# Patient Record
Sex: Female | Born: 1970 | Race: Black or African American | Hispanic: No | Marital: Single | Smoking: Current every day smoker
Health system: Southern US, Community
[De-identification: ages and names within clinical notes are randomized; demographics above are authoritative.]

---

## 2002-11-27 ENCOUNTER — Other Ambulatory Visit: Admission: RE | Admit: 2002-11-27 | Discharge: 2002-11-27 | Payer: Self-pay | Admitting: Obstetrics and Gynecology

## 2003-03-06 ENCOUNTER — Emergency Department (HOSPITAL_COMMUNITY): Admission: EM | Admit: 2003-03-06 | Discharge: 2003-03-06 | Payer: Self-pay | Admitting: Emergency Medicine

## 2003-03-06 ENCOUNTER — Encounter: Payer: Self-pay | Admitting: Emergency Medicine

## 2003-05-01 ENCOUNTER — Inpatient Hospital Stay (HOSPITAL_COMMUNITY): Admission: AD | Admit: 2003-05-01 | Discharge: 2003-05-03 | Payer: Self-pay | Admitting: Obstetrics and Gynecology

## 2004-06-29 ENCOUNTER — Emergency Department (HOSPITAL_COMMUNITY): Admission: EM | Admit: 2004-06-29 | Discharge: 2004-06-30 | Payer: Self-pay | Admitting: Emergency Medicine

## 2004-06-30 ENCOUNTER — Inpatient Hospital Stay (HOSPITAL_COMMUNITY): Admission: AD | Admit: 2004-06-30 | Discharge: 2004-06-30 | Payer: Self-pay | Admitting: Obstetrics and Gynecology

## 2004-07-14 ENCOUNTER — Encounter (INDEPENDENT_AMBULATORY_CARE_PROVIDER_SITE_OTHER): Payer: Self-pay | Admitting: Specialist

## 2004-07-14 ENCOUNTER — Ambulatory Visit (HOSPITAL_COMMUNITY): Admission: RE | Admit: 2004-07-14 | Discharge: 2004-07-14 | Payer: Self-pay | Admitting: Obstetrics and Gynecology

## 2004-11-26 ENCOUNTER — Other Ambulatory Visit: Admission: RE | Admit: 2004-11-26 | Discharge: 2004-11-26 | Payer: Self-pay | Admitting: Obstetrics and Gynecology

## 2007-08-08 ENCOUNTER — Inpatient Hospital Stay (HOSPITAL_COMMUNITY): Admission: RE | Admit: 2007-08-08 | Discharge: 2007-08-10 | Payer: Self-pay | Admitting: Obstetrics and Gynecology

## 2007-09-04 ENCOUNTER — Ambulatory Visit: Payer: Self-pay | Admitting: Family Medicine

## 2007-09-04 ENCOUNTER — Inpatient Hospital Stay (HOSPITAL_COMMUNITY): Admission: EM | Admit: 2007-09-04 | Discharge: 2007-09-07 | Payer: Self-pay | Admitting: Emergency Medicine

## 2007-09-04 ENCOUNTER — Ambulatory Visit: Payer: Self-pay | Admitting: Cardiology

## 2007-09-05 ENCOUNTER — Encounter: Payer: Self-pay | Admitting: Family Medicine

## 2007-09-07 ENCOUNTER — Encounter (INDEPENDENT_AMBULATORY_CARE_PROVIDER_SITE_OTHER): Payer: Self-pay | Admitting: Interventional Radiology

## 2007-09-17 ENCOUNTER — Ambulatory Visit: Payer: Self-pay | Admitting: Family Medicine

## 2007-09-17 DIAGNOSIS — E049 Nontoxic goiter, unspecified: Secondary | ICD-10-CM | POA: Insufficient documentation

## 2007-09-17 DIAGNOSIS — O903 Peripartum cardiomyopathy: Secondary | ICD-10-CM

## 2007-10-02 ENCOUNTER — Ambulatory Visit: Payer: Self-pay | Admitting: Cardiology

## 2007-10-02 LAB — CONVERTED CEMR LAB
BUN: 10 mg/dL (ref 6–23)
Calcium: 9 mg/dL (ref 8.4–10.5)
Creatinine, Ser: 0.9 mg/dL (ref 0.4–1.2)
GFR calc Af Amer: 90 mL/min
GFR calc non Af Amer: 74 mL/min
Glucose, Bld: 118 mg/dL — ABNORMAL HIGH (ref 70–99)
Potassium: 4 meq/L (ref 3.5–5.1)
Pro B Natriuretic peptide (BNP): 193 pg/mL — ABNORMAL HIGH (ref 0.0–100.0)

## 2010-03-21 ENCOUNTER — Emergency Department (HOSPITAL_COMMUNITY): Admission: EM | Admit: 2010-03-21 | Discharge: 2010-03-21 | Payer: Self-pay | Admitting: Emergency Medicine

## 2010-09-12 ENCOUNTER — Encounter: Payer: Self-pay | Admitting: *Deleted

## 2010-10-08 LAB — DIFFERENTIAL
Basophils Absolute: 0 10*3/uL (ref 0.0–0.1)
Basophils Relative: 0 % (ref 0–1)
Eosinophils Absolute: 0 10*3/uL (ref 0.0–0.7)
Eosinophils Relative: 0 % (ref 0–5)
Lymphocytes Relative: 18 % (ref 12–46)
Lymphs Abs: 1.1 10*3/uL (ref 0.7–4.0)
Monocytes Absolute: 0.2 10*3/uL (ref 0.1–1.0)
Monocytes Relative: 3 % (ref 3–12)
Neutro Abs: 5 10*3/uL (ref 1.7–7.7)
Neutrophils Relative %: 79 % — ABNORMAL HIGH (ref 43–77)

## 2010-10-08 LAB — COMPREHENSIVE METABOLIC PANEL
ALT: 18 U/L (ref 0–35)
AST: 33 U/L (ref 0–37)
Albumin: 3.8 g/dL (ref 3.5–5.2)
Alkaline Phosphatase: 44 U/L (ref 39–117)
BUN: 10 mg/dL (ref 6–23)
CO2: 23 mEq/L (ref 19–32)
Calcium: 8.7 mg/dL (ref 8.4–10.5)
Chloride: 107 mEq/L (ref 96–112)
Creatinine, Ser: 0.98 mg/dL (ref 0.4–1.2)
GFR calc Af Amer: >60 mL/min (ref >60)
GFR calc non Af Amer: >60 mL/min (ref >60)
Glucose, Bld: 142 mg/dL — ABNORMAL HIGH (ref 70–99)
Potassium: 3.3 mEq/L — ABNORMAL LOW (ref 3.5–5.1)
Sodium: 136 mEq/L (ref 135–145)
Total Bilirubin: 1 mg/dL (ref 0.3–1.2)
Total Protein: 6.9 g/dL (ref 6.0–8.3)

## 2010-10-08 LAB — LIPASE, BLOOD: Lipase: 24 U/L (ref 11–59)

## 2010-10-08 LAB — CBC
HCT: 37.8 % (ref 36.0–46.0)
Hemoglobin: 12.7 g/dL (ref 12.0–15.0)
MCH: 29.1 pg (ref 26.0–34.0)
MCHC: 33.6 g/dL (ref 30.0–36.0)
MCV: 86.7 fL (ref 78.0–100.0)
Platelets: 139 10*3/uL — ABNORMAL LOW (ref 150–400)
RBC: 4.36 MIL/uL (ref 3.87–5.11)
RDW: 14.2 % (ref 11.5–15.5)
WBC: 6.3 10*3/uL (ref 4.0–10.5)

## 2010-10-08 LAB — URINALYSIS, ROUTINE W REFLEX MICROSCOPIC
Glucose, UA: NEGATIVE mg/dL
Hgb urine dipstick: NEGATIVE
Ketones, ur: 40 mg/dL — AB
Nitrite: NEGATIVE
Protein, ur: NEGATIVE mg/dL
Specific Gravity, Urine: 1.029 (ref 1.005–1.030)
Urobilinogen, UA: 0.2 mg/dL (ref 0.0–1.0)
pH: 6 (ref 5.0–8.0)

## 2010-10-08 LAB — POCT PREGNANCY, URINE: Preg Test, Ur: NEGATIVE

## 2010-12-07 NOTE — Discharge Summary (Signed)
NAMESEMIAH, KONCZAL NO.:  [id number]   MEDICAL RECORD NO.:  [id number]          PATIENT TYPE:  INP   LOCATION:  6527                         FACILITY:  [facility]   PHYSICIAN:  Leighton Roach McDiarmid, M.D.DATE OF BIRTH:  09-25-1970   DATE OF ADMISSION:  09/04/2007  DATE OF DISCHARGE:  09/07/2007                               DISCHARGE SUMMARY   ADDENDUM:  This addendum just includes the patient's contact information.  Her home  phone number is [phone], that is in [place].  Her cell phone number  is [phone], also in [place].  Her address is [address]  [place], [place] [address].      Asher Muir, MD  Electronically Signed      Leighton Roach McDiarmid, M.D.  Electronically Signed    SO/MEDQ  D:  09/07/2007  T:  09/08/2007  Job:  [id number]

## 2010-12-07 NOTE — Consult Note (Signed)
[name], [name] NO.:  [id number]   MEDICAL RECORD NO.:  [id number]          PATIENT TYPE:  INP   LOCATION:  6527                         FACILITY:  [facility]   PHYSICIAN:  Nestor Ramp, MD        DATE OF BIRTH:  [birth year]   DATE OF CONSULTATION:  09/05/2007  DATE OF DISCHARGE:                                 CONSULTATION   TIME OF CONSULTATION:  1445.   CONSULTING SURGEON:  Rita Burke, M.D.   REQUESTING PHYSICIAN:  Nestor Ramp, MD.   PRIMARY CARE PHYSICIAN:  Rita Burke, M.D. with gynecology.   REASON FOR CONSULTATION:  Right thyroid mass with possible associated  airway compression.   HISTORY OF PRESENT ILLNESS:  We have been asked by Dr. Denny Levy to  evaluate 40 year old Rita Burke.  She is recently status post vaginal  delivery August 08, 2007 of a healthy baby.  No significant  complications during the postpartum period.  She did have mild pregnancy-  induced hypertension, but did not have any symptoms consistent with  heart failure or other complications.  She was subsequently discharged  home.  She presented to the ER on the date of admission, September 04, 2007, complaining of shortness of breath.  She reports this has been  progressive in nature, intermittent and becoming more frequent with the  main respiratory symptoms consisting of orthopnea and dyspnea on  exertion.  She was not experiencing any type of cough, fevers or chills  that she can recall.  In the ER, chest x-ray was consistent with  probable heart failure, edema and possible bronchitis.  She has been  admitted.  Cardiology consult has been obtained.  A workup is in  progress to rule out postpartum cardiomyopathy.  She has been treated  with Lasix and her respiratory symptoms have markedly improved.  She is  still on oxygen, but she reports her orthopnea and dyspnea on exertion  are better.  She does have a known history of a right thyroid mass since  2004 and has not  been evaluated by a surgical physician for this.  Since  admission, she has had thyroid studies checked.  The TSH and T3-T4 are  all within normal limits.  Her white count has also been normal.  She  has undergone a CT of the neck and chest today that shows patchy air  space disease as well as a right thyroid mass measuring 4.3 x 3.6 cm.  There is associated subglottic tracheal decrease in size to a diameter  of 7.5 mm.  We have been asked by the medicine team to evaluate the  patient because of the tracheal abnormality seen on CT.   REVIEW OF SYSTEMS:  As per the history present illness.  Constitutional,  again the patient has reported no fevers, chills or other infectious-  type symptoms prior to onset of symptoms.  She has noticed over the past  years, slight progressive increase in size of the anterior neck goiter  especially on the right.  Her respiratory symptoms are as above.  They  have become better since admission.  From a cardiac standpoint, she does  report mild problems with hypertension at the end of the prior  pregnancy.  After discharge, she was experiencing some lower extremity  edema.  Otherwise all other systems are negative or noncontributory.   FAMILY MEDICAL HISTORY:  Significant for cardiac disease and diabetes.  No known history of thyroid disorder.  Otherwise noncontributory.   SOCIAL HISTORY:  The patient has a boyfriend.  She has 3 children, 2 of  which who live with her.  She denies tobacco or alcohol use, but the  initial H&P, the patient did admit to drinking some vodka several days  prior to admission.  She does work as a Associate Professor.   PAST MEDICAL HISTORY:  1. Known right thyroid mass since 2004.  2. Mild pregnancy-induced hypertension with most recent pregnancy.  3. Anemia secondary recent pregnancy and delivery.   PAST SURGICAL HISTORY:  D&C for spontaneous abortion in 2005.   ALLERGIES:  NKDA.   CURRENT MEDICATIONS:  While here at the hospital  include the following  medications:  1. Lisinopril 5 mg daily started today.  2. Lovenox for DVT prophylaxis.  3. Aspirin.  4. Lasix 40 mg IV b.i.d.  5. Micronor daily.  6. Tessalon Perles t.i.d. cough.  Apparently the patient has developed      a cough since presenting to the hospital.   PHYSICAL EXAMINATION:  GENERAL:  The patient is pleasant in no apparent  acute distress.  She is examined while lying supine in the bed.  Oxygen  is in place.  VITAL SIGNS:  Temperature 97.5, BP 137/86, pulse 06 and regular,  respirations 16.  HEENT:  Eyes are normal in appearance.  Lids are  normal.  No edema.  Sclerae noninjected.  Conjunctivae are pink without  any exudate.  Pupils are equal and react to light.  Ears are without  lesions.  Otoscopic exam was deferred.  Nose is without rhinorrhea or  bleeding.  Mouth is pink and moist.  No noted ulcers.  NECK:  Trachea is midline.  Thyroid on inspection, there is obvious  visible goiter very enlarged on the right with some associated bulging  which is subtle on the left probably secondary to subtle mass effect  from the very large mass on the right side of the neck.  The right neck  mass is in the lower part of the neck, the upper part of the thyroid.  It is firm and mobile, but is nontender.  I do not feel any secondary  nodularity.  I am unable to appreciate any other masses other than this  mass.  Pressing on this mass does not elicit any respiratory symptoms.  RESPIRATORY:  Respiratory effort is nonlabored and normal.  She is  currently using cannula O2.  She is lying flat without any obvious signs  of respiratory distress.  LUNGS:  Sounds are clear bilaterally without any wheeze, rhonchi or  rales.  CARDIOVASCULAR:  Heart sounds are S1-S2 without obvious rubs, murmurs,  thrills or gallops.  Her pulses are regular.  Telemetry monitor  demonstrates sinus tachycardia.  CHEST:  The breasts are symmetrical in  appearance.  Otherwise, breast exam  has been deferred.  GI:  Abdomen is soft.  Bowel sounds are present.  Her abdomen is  nontender in all 4 quadrants.  There is no obvious hepatosplenomegaly,  masses or bruits.  No scars.  LYMPHATICS:  There is significant enlargement in the neck  on the right  side, deferring any appreciable evaluation with palpation of any right  neck adenopathy and none appreciated on the left side of the neck.  There was no obvious infraclavicular adenopathy.  MUSCULOSKELETAL:  No obvious cyanosis or clubbing.  EXTREMITIES:  Symmetrical in appearance.  SKIN:  Benign.  NEURO:  Cranial nerves II-XII are grossly intact.  DTR exam was  deferred.  Gross examination of sensation in the upper and lower  extremities shows intact sensation.  PSYCHIATRIC:  The patient is oriented to time, person, place and  situation.  Her affect is normal.   LABORATORY DATA:  Sodium 138, potassium 3.4, CO2 26, glucose 92, BUN 7,  creatinine 0.90, white count 6200, hemoglobin 12.1, platelets 155,000,  TSH 1.728, T3 is 3.7, T4 is 1.11.   DIAGNOSTICS:  1. CT of the neck and chest as noted.  In addition, the radiologist      although he is concerned about a possible malignancy regarding the      right neck mass, he is unable to appreciate any lymph nodes that      would be consistent with malignancy in the neck.  2. Chest x-ray as per the history of present illness.  3. A 2-D echo has been ordered and has been completed, but physician      has not read.   IMPRESSION:  1. Enlarging right thyroid mass without evidence of acute thyroiditis,      rule out malignancy.  2. 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  3. Questioning underlying bronchitis based on admission chest x-ray      and symptoms.  4. Narrowed trachea seen on CT.  Currently, the patient compensated      from a symptom standpoint.  5. Anemia secondary to recent  pregnancy and delivery.  6. Recent mild pregnancy-induced hypertension with pregnancy that      ended January 2009.   PLAN:  1. We will go ahead ultrasound the neck.  This would help clarify the      lesion whether it is solid versus cystic.  This was partially seen      on a neck CT.  This will help determine potential timing of the      surgical intervention.  On exam, this is a firm lesion and will      definitely need eventual biopsy and excision at some point.  As      noted, the thyroid studies and clinical exam exclude acute      thyroiditis.  2. At this time, feel that the majority of respiratory symptoms are      related more to the patient's presentation with heart failure and      possible postpartum cardiomyopathy and not compression from the      thyroid mass.  The mass has been enlarging on a progressive nature      with probably progressive narrowing in the trachea.  Her initial      presentation of symptoms are also more consistent with a pulmonary      etiology for her symptoms, i.e. hypervolemic CHF as evidenced by      symptoms of orthopnea and dyspnea on exertion which have improved      since treatment for these conditions.  3. Dr. Luisa Hart will review the CT as well as the ultrasound which we      have ordered.  4. Recommend weaning  her O2 and see how she does off oxygen.  At this      time, I do not see any urgent need for surgical excision of neck      mass, noting that with compression over the neck mass, no      respiratory symptoms or cough are elicited.  Therefore, surgery can      probably be elective.  Additional recommendations per Dr. Luisa Hart.      Allison L. Rolene Course      Nestor Ramp, MD  Electronically Signed    ALE/MEDQ  D:  09/05/2007  T:  09/06/2007  Job:  [id number]   cc:   Nestor Ramp, MD  Jonelle Sidle, MD  Rita A. Normand Sloop, M.D.

## 2010-12-07 NOTE — Discharge Summary (Signed)
Rita Burke, Rita Burke NO.:  [id number]   MEDICAL RECORD NO.:  [id number]          PATIENT TYPE:  INP   LOCATION:  6527                         FACILITY:  [facility]   PHYSICIAN:  Leighton Roach McDiarmid, M.D.DATE OF BIRTH:  11-Sep-1970   DATE OF ADMISSION:  09/04/2007  DATE OF DISCHARGE:  09/07/2007                               DISCHARGE SUMMARY   PRIMARY CARE Shaheed Schmuck:  Gentry Fitz.   CONSULTANTS:  Cardiology and General Surgery.   PROCEDURES:  2D echocardiogram, CT of chest and neck, neck ultrasound,  and fine-needle aspiration with biopsy.   REASON FOR ADMISSION:  The patient is a 40 year old female status post  normal spontaenous vaginal delivery on August 07, 2006, who presents  with 2 days of shortness of breath and 7 to 10 days of cough and chest  pain.   LABORATORY DATA:  Pertinent admission labs include CBC that showed a  white blood cell count of 7, hemoglobin 14, hematocrit 42.7, and  platelets 176.  Basic metabolic panel that was essentially within normal  limits with a sodium of 140, potassium 3.5, chloride 106, bicarb 26, BUN  6, creatinine 0.87, and glucose 113.  BNP was found to be 1463.  LFTs  were within normal limits and UA showed large hemoglobin, small  leukocytes esterase, and rare bacteria.  PT was 13.5, INR was 1, and PTT  was 26.  Admission studies included a chest x-ray that showed cardiac  enlargement and edema that was consistent with CHF, also could not  exclude a coexistent bronchitis.  An EKG was performed which showed  sinus tachycardia, left atrial enlargement, and T-wave abnormalities in  V5, V6, aVL, and I.   DISCHARGE DIAGNOSES:  1. Peripartum cardiomyopathy.  2. Thyroid mass.  3. Viral upper respiratory infection.   MEDICATIONS:  Medications at the time of discharge include:  1. Lisinopril 5 mg orally daily.  2. Coreg 6.25 mg orally b.i.d.  3. Spironolactone 25 mg orally daily.  4. Ortho-Tri-Cyclen Lo one tablet daily, the  patient was to start this      when she finishes this month's Micronor oral contraceptive which      she is currently taking.  The patient was also strongly encouraged      to use condoms in addition to her oral birth control pills.   BRIEF HISTORY AND HOSPITAL COURSE:  1. Cardiovascular.  The patient had signs and symptoms and      echocardiographic findings consistent with peripartum      cardiomyopathy.  Cardiology was consulted in the emergency      department and echo was obtained that showed a markedly dilated      left ventricle with an ejection fraction estimated to be 25%.      There was also left ventricular hypokinesis with regional      variations, but no evidence of regional wall motion abnormalities.      There was also mildly increased left ventricular wall thickness.      Aortic valve was found to be mildly calcified, and there was      trivial  aortic valvular regurgitation.  There was mild mitral valve      regurgitation with an effective orifice of mitral regurgitation,      surface area of 0.18 sq cm.  The volume of mitral regurgitation by      proximal isovelocity surface area was 24 mL.  Additionally, the      left atrium was mildly dilated.  On initial presentation, it was      felt that a pulmonary embolism needed to be ruled out.  Thus, the      patient had a chest CT angio which showed no pulmonary embolism.      The patient had initially been started on Lovenox and aspirin, both      of those were discontinued.  She was started on Coreg, which was      eventually titrated up to 6.25 mg b.i.d.  She was started on      lisinopril 5 mg.  She was initially started on Lasix which was      changed to spironolactone 25 mg daily.  The patient is to follow up      with Dr. Diona Browner at [facility].  Fasting lipid values,      cholesterol 167, triglycerides 45, HDL 62, LDL 96, and total      cholesterol-to-HDL ratio 2.7.  The patient was tested for HIV which       came back negative.  The patient also had a urine drug screen which      came back positive only for marijuana.  An  alcohol level was      tested within 24 hours of admission and came back with an ETOH      level of 7.  The patient did state that she had a drink prior to      coming in to the emergency department in order to try to help with      her cough.  2. Thyroid mass.  On admission, the patient was found to have a large      right-sided thyroid mass.  CT of her neck showed a large thyroid      mass with the right lobe of the thyroid measuring 4.7 x 4.1 cm and      displacing the airway to the left narrowing the airway to 7.6 mm.      Following that, an ultrasound of the mass was done which showed a      dominant complex mass in the right lobe of the thyroid with a      biopsy recommended.  Surgery consult was obtained and surgeons      recommended a fine-needle aspiration of the mass with a biopsy, and      the patient is to follow up with surgery in 2 to 3 weeks and      discuss the result and plans based on the pathology of that biopsy.      At the time of this dictation, the patient is receiving her fine-      needle aspiration and will be able to be discharged after that.  If      there are any complications or changes, a discharge summary      addendum will be dictated as well.  The patient did have thyroid      hormones measured which were all within normal limits.  Her TSH was      1.728, her free T3 was 3.7, and her free T4  was 1.11.  3. Birth control.  Because of the patient's diagnosis of peripartum      cardiomyopathy, it is extremely important that she does not get      pregnant until cleared by cardiology.  This was discussed multiple      times with the patient both by family practice and by cardiology.      The patient stated to me that she does not wish to have any more      children.  We have discussed different options for birth control at      the time of  discharge.  The patient prefers to take Ortho-Tri-      Cyclen Lo, which she has tolerated well in the past.  At the      current time, she is taking Micronor.  So, the patient was      instructed to start the Ortho-Tri-Cyclen Lo as soon as this month's      Micronor is finished.  The patient is also strongly encouraged to      use condoms in additions to her oral contraceptives.  The patient      is scheduled to follow up with her obstetrician at [facility]      in approximately 2 weeks.  At that time, she plans to discuss the      possibility of getting an IUD or a bilateral tubal ligation.  The      patient seems to understand the risk of pregnancy given her      diagnosis of peripartum cardiomyopathy.  4. No primary care Chandrea Zellman.  The patient has been without a primary      care Roselle Norton for quite some time.  She goes to urgent care when      she is sick.  She has gotten regular OB care when she has been      pregnant.  Discussed the possibilities with the patient.  She has      agreed to come and see me in my clinic at [facility] with an appointment on September 17, 2007.  5. Cough.  Based on the chest x-ray and the patient's symptoms, this      was thought to be probably due to a viral upper respiratory      infection.  During her hospitalization, she was given Jerilynn Som and she may use guaifenesin or other mucolytics over the      counter as needed for cough.   CONDITION ON DISCHARGE:  Stable and improved.   DISPOSITION:  The patient was discharged home in stable condition.  Followup appointments are as follows:  1. The patient is to follow up with me, Dr. Asher Muir, at [facility] on September 17, 2007, at 01:30 p.m.  2. The patient is to follow up with Dr. Diona Browner at [facility]      Cardiology, phone number [phone], on October 02, 2007.  She has a      lab appointment at 2:45 and a doctor's appointment at 3:15.   She      has been instructed to record her daily weight and bring those with      her to her appointment.  3. The patient is to follow up with Dr. Luisa Hart at [facility]      Surgery, phone number [phone].  The patient is to call for an  appointment to be scheduled in 2 to 3 weeks.   FOLLOWUP ISSUES AT THE TIME OF DISCHARGE:  1. The pathology of the thyroid fine-needle aspiration must be      followed up.  Surgery is to do that.  2.  Birth control, the      patient has been encouraged to find a more permanent method of      birth control as she does not wish to have any more children and a      pregnancy at this time could be very detrimental to her health.  2. Titration of the patient's medications for CHF.  This is to be done      by cardiology primarily.      Asher Muir, MD  Electronically Signed      Leighton Roach McDiarmid, M.D.  Electronically Signed    SO/MEDQ  D:  09/07/2007  T:  09/08/2007  Job:  [id number]   cc:   [facility]  Jonelle Sidle, MD  Clovis Pu Cornett, M.D.

## 2010-12-07 NOTE — Consult Note (Signed)
Rita Burke, Rita Burke NO.:  [id number]   MEDICAL RECORD NO.:  [id number]          PATIENT TYPE:  EMS   LOCATION:  MAJO                         FACILITY:  [facility]   PHYSICIAN:  Rita Sidle, MD DATE OF BIRTH:  01-14-1971   DATE OF CONSULTATION:  09/04/2007  DATE OF DISCHARGE:                                 CONSULTATION   CHIEF COMPLAINT:  Shortness of breath and cough.  Date of consultation, 09/04/07   HISTORY OF PRESENT ILLNESS:  This is a 40 year old, G5, P3-0-2-3, female  with history of normal vaginal delivery around August 08, 2007, with a  2-day hospital course who two days prior to admission developed coughing  of yellow mucus with associated shortness of breath, worse when lying  flat, not affected by exertion and chest pain for 1 day, as per patient  secondary to cough.  She has not had symptoms like this in the past.  She has not taken any medications for her symptoms.  Her symptoms  improve when she is sitting up but are not affected by rest.  She has  not had similar problems with prior pregnancies or in the past.  She had  one episode of emesis in the emergency room during this encounter  secondary to a coughing spell.  In the emergency room, she was given  Lasix 40 IV, nitroglycerin, and Zofran.  She notes that her lower  extremities have remained a bit swollen following her delivery, but have  generally improved.  She has not been complaining of lightheadedness or  feelings of palpitations.  There has been no radiation of her chest pain  and is only present when she is coughing.  She has had no hemoptysis.  She has no primary care physician but is seen by a GYN physician with  [facility].   ALLERGIES:  No known drug allergies.   MEDICATIONS:  Some type of oral contraceptive pill that begins with an  M, that she has been taking for one month to help with her breast  feeding, though she has not been breast feeding.  She also takes an  iron  pill once a day.   PAST MEDICAL HISTORY:  GYN history as previously stated and anemia.  She  also has a history of a right thyroid nodule since 2004 that was  reportedly associated with normal to borderline TFTs, although they are  not in the medical record.  She states that 10 months ago she noticed  that the thyroid nodule had started getting bigger but has been stable  over the last few months.  She has not had this worked up recently.  Also includes two spontaneous abortions.   SOCIAL HISTORY:  She lives in [place] with her boyfriend and two of  her daughters.  Her oldest child lives with the child's father, who is  not her current boyfriend.  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  She has no history of smoking.  She  has not drunk alcohol for a year but had 2 ounces of  vodka yesterday to  help with her cough which had no effect.  She used marijuana 2 weeks ago  but denies other herbal medications or illicit drugs.   FAMILY HISTORY:  She has a healthy mother.  Her father has insulin-  dependent diabetes, hypertension, and some sort of heart problem, but  she does not know what kind or how old he was when he developed it.  She  has two sisters who are doing okay.   REVIEW OF SYSTEMS:  Pertinent for persistent scant bloody discharge  since her delivery which is similar to what she has had in the past  following deliveries.   PHYSICAL EXAMINATION:  VITAL SIGNS:  Temperature 97.9, pulse 120,  respiratory rate 22, blood pressure 164/113, O2 saturation 97% on room  air, 99% on 2 liters.  GENERAL APPEARANCE:  She looked a little uncomfortable until she had a  coughing fit that led to a brief episode of emesis with mild to moderate  amount of yellow sputum. After doing so, she did appear much more  comfortable.  She does appear her apparent stated age.  HEENT:  Exam was essentially normal.  NECK:  She had a large, firm, nontender apparent  right thyroid mass that  extended over the midline of her throat.  No carotid bruits, no JVD.  No  bruit was auscultated over the thyroid mass.  She had no  lymphadenopathy.  CARDIOVASCULAR:  S1, S2.  No murmurs.  When I examined her, she had  regular rate and rhythm with a heart rate of 98, but she was tachycardic  before and then tachycardic again later during the encounter based on  her monitors.  Possible aoft S3.  LUNGS:  Clear to auscultation bilaterally.  No crackles or rhonchi  despite her x-ray findings.  SKIN:  She had no rashes.  ABDOMEN:  Soft, nontender, nondistended. Normal bowel sounds.  EXTREMITIES:  She had trace bilateral pitting edema.  No calf  tenderness, no palpable cords.  NEUROLOGIC:  She was alert and oriented x3.  Cranial nerves II-XII  intact.   Chest x-ray was pertinent for mild edema pattern, could not rule out  bronchitis.   EKG showed some nonspecific ST changes in the V4, V5, and V6 with  question of left atrial enlargement, otherwise normal sinus rhythm.  Normal axes, no hypertrophy.  No previous EKGs to compare it to.   LABORATORY DATA:  Hemoglobin 14, hematocrit 42.7, white blood cell count  7, platelets 176.  Sodium140, potassium 4.3, chloride 110, bicarb 26,  BUN 7, creatinine 1, glucose 99.  She had a D-dimer of 1.99.  MB 1.3,  troponin less than 0.05.   ASSESSMENT AND PLAN:  A 40 year old female, gravida 5, para 3-0-2-3, one  month post normal vaginal delivery with 2 days of cough, shortness of  breath, pleuritic chest pain, and large thyroid mass.  Chest x-ray and  BNP findings consistent with volume overload, question heart failure.  Potential etiologies include postpartum cardiomyopathy, thyroiditis  contributing to a cardiomyopathy.  Also possible is a pulmonary embolus  among other potential etiologies of her symptoms.  We have contacted  family medicine who will be admitting the patient, and we will be  following along with them.  We  are deferring on a CT scan with contrast  secondary to the potential that she has a thyroid condition that would  be exacerbated by iodine-based contrast media, so we will order a 2-D  echocardiogram for now, obtain thyroid  function tests.  We will treat  with low-dose Lovenox in case she does have a PE.  Recommend admission  to a telemetry bed, and following the results of her thyroid workup and  2-D echocardiogram, can determine if a CT scan is warranted.  If a CT  scan is ordered, would the thyroid mass be better visualized with a CT  scan or with an ultrasound, and that will be based on whether or not a  CT scan is obtained for  the potential of a pulmonary embolus.  We will also cycle cardiac  enzymes and serial EKGs are two things we would recommend.  We will get  another set of cardiac enzymes while she is in the emergency room in  addition to a PT, PTT, TSH, free T4, and T3.  This has been discussed  with Dr. Alanda Amass of family practice.      Rita Burke, M.D.  Electronically Signed      Rita Sidle, MD  Electronically Signed    JC/MEDQ  D:  09/04/2007  T:  09/04/2007  Job:  [id number]

## 2010-12-07 NOTE — H&P (Signed)
Rita Burke, Rita Burke                ACCOUNT NO.:  [id number]   MEDICAL RECORD NO.:  [id number]          PATIENT TYPE:  INP   LOCATION:                                FACILITY:  [facility]   PHYSICIAN:  Naima A. Dillard, M.D. DATE OF BIRTH:  10-24-1970   DATE OF ADMISSION:  08/08/2007  DATE OF DISCHARGE:                              HISTORY & PHYSICAL   This patient is a gravida 5, para 2-0-2-2, at 39-6/7 weeks, who  presented at the office with complaint of contractions about every 15  minutes with a little bit of spotting and also with a complaint of  continued lower extremity edema.  The patient was seen for a labor check  at the office and her cervix was noted to be 4.5 cm, 80% effaced and -2  to -1, vertex presentation, with a bulging bag of water.  The patient's  weight in the office was 225.  She had a trace amount of protein in her  urine.  Blood pressure was 140/70.  She denied headache or epigastric  pain or blurry vision.  She did report some occasional dizziness,  especially with position changes.  She denied any leakage of fluid and.  to note, group beta strep was positive.  Her care has been followed by  the certified nurse midwife service at [facility] and history is  remarkable for:   1. Advanced maternal age.  The patient is 40 years old.  2. Her rubella titer was equivocal.  3. History of spontaneous abortion x2.  4. Group beta strep positive.   PRENATAL LABS:  On February 15, 2007, the patient's blood type was noted to  be A positive.  Her Rh antibody screen was negative.  Sickle cell  negative.  RPR nonreactive.  Rubella equivocal.  Hepatitis surface  antigen negative.  HIV nonreactive.  Her hemoglobin upon that day was  11.4 and her platelets were 171.  As was noted, her group beta strep was  positive.  At approximately 28 weeks the patient had a one-hour GTT and  hemoglobin was 10.5.  RPR was nonreactive.  Her one-hour GTT at 28 weeks  was within normal limits,  equal to 126.  The patient declined  amniocentesis as well as quad screen.   ALLERGIES:  The patient denies medication or latex allergy as well as  other sensitivities.   PAST MEDICAL HISTORY:  The patient reports oral contraceptive pill use  in the past, which she discontinued April 2008.  She reports a D&C in  2006.  She was treated for chlamydia in 1990.  Other than her  childbirth, she has not been hospitalized for any other complications or  surgery.   FAMILY HISTORY:  Father with high blood pressure.  Father is also  insulin-dependent diabetic.   GENETIC HISTORY:  The patient is advanced maternal age.  She is 40 years  old.  Otherwise unremarkable.   SOCIAL HISTORY:  The patient is a single black female.  Father of the  baby's name is Rita Burke.  They do not report a religious  affiliation.  They both have high school educations.  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  She denied any alcohol, tobacco or illicit drug use.   OBSTETRICAL HISTORY:  The patient had a vaginal delivery in June 1991, a  female infant weighing 7 pounds at approximately 40 weeks' gestation.  She  was in labor 8-9 hours.  She denied any complications.  The child's name  is Rita Burke and was delivered by Dr. Pennie Rushing.  Gravida 2, the patient had a  vaginal delivery in October 2004, a female weighing 7 pounds at  approximately 40 weeks' gestation.  She was in labor for 6-7 hours.  She  denied any complications.  That delivery was by Hilda Lias L. Williams,  C.N.M.  Her daughter's name is Rita Burke.  In December 2005 she had a  spontaneous abortion at approximately 7 weeks with a subsequent D&C and  in March 2006 was gravida 4, she had a spontaneous abortion around 7  weeks as well with a subsequent D&C.  Gravida 5 is her current  pregnancy.   HISTORY OF PRESENT PREGNANCY:  The patient entered care February 08, 2007,  for her new OB nurse interview.  She  then had on July 24 her new OB  workup.  She was approximately [redacted] weeks pregnant.  Implications  regarding advanced maternal age were discussed.  The patient at that  time was unsure regarding amniocentesis or quad screen.  The patient was  to let the office know if she desired either of those two.  She then  returned around 19 weeks for her anatomy scan and planned CNM care at  that time.  Still had some persisting nausea and was to fill a Zofran.  Her ultrasound that day was showing fetal intrauterine pregnancy with  size equal to dates, placenta was posterior, cervix 3.13 cm with normal  growth and fluid and normal anatomy.  Prior risk was previously 1 to 185  and after normal anatomy scan was 1 in 370 risk.  Again discussed  amniocentesis and quad screen and the patient continued to decline at  that time.  The patient did have a normal one-hour GTT around 28 weeks  and her hemoglobin was 10.5 at that time.  Did discuss the need for  rubella vaccine postpartum secondary to equivocal status.  The patient  was doing well and without complaints.  The patient's pregnancy  progressed without additional complications.  Her cervix was  approximately 2 cm at around 36 weeks.  Blood pressure did begin to  elevate slightly around 37 weeks.  She was 138/70 and repeat was 130/70.  Group beta strep was done at 35-6/7 weeks and was noted to be positive.  The patient was seen at 39 weeks 1 day and noted female baby to be named  [place].  She did report irregular contractions.  She did develop dependent  lower extremity edema, 3+ pitting.  Her blood pressure was 140/82 at  that time.  She did not have any PIH signs or symptoms and her cervix  was 3+ cm.  The patient was then seen in office January 14 at 39-6/7  weeks and planned for direct admit secondary to early labor.   PHYSICAL EXAM:  The patient's weight was 225 in office today.  Blood  pressure was 140/70.  The patient was afebrile and other vital  signs  were stable.  She had had trace protein on her urine  sample in the  office.  GENERAL:  She was not in any acute distress.  She was alert and oriented  x4 and pleasant.  HEENT:  Within normal limits.  CARDIOVASCULAR:  Regular rate and rhythm.  LUNGS:  Clear to auscultation bilaterally.  ABDOMEN:  Soft and nontender, gravid, fundal height was approximately 40  cm.  Fetal heart tones were in the 140s.  PELVIC:  The patient's cervix was 4.5 cm, 80%, and -2 to -1 station,  vertex, with a bulging bag of water.  A small amount of bloody show was  noted.  EXTREMITIES:  Bilateral lower extremities with 3+ pitting edema.  DTRs  were 2+.   ASSESSMENT:  1. Intrauterine pregnancy at 39-6/7 weeks.  2. Early labor.  3. Group beta strep positive.  4. Advanced maternal age.  5. Rubella equivocal.   PLAN:  1. Admit to birthing suites as a direct admission with Dr. Normand Sloop as      attending physician.  2. Routine certified nurse midwife orders.  3. Epidural p.r.n. although the patient in office voiced to try      natural childbirth.  4. If blood pressure is borderline on admission, plan PIH labs.  5. Support p.r.n.  6. Penicillin IV q.4h. per GBS prophylactic protocol.  7. After IV administration of antibiotics, plan artificial rupture of      membranes for labor augmentation.      Candice Pasatiempo, CNM      Naima A. Normand Sloop, M.D.  Electronically Signed    CHS/MEDQ  D:  08/08/2007  T:  08/08/2007  Job:  [id number]

## 2010-12-07 NOTE — H&P (Signed)
Rita Burke, Rita Burke NO.:  [id number]   MEDICAL RECORD NO.:  [id number]          PATIENT TYPE:  INP   LOCATION:  6527                         FACILITY:  [facility]   PHYSICIAN:  Nestor Ramp, MD        DATE OF BIRTH:  May 02, 1971   DATE OF ADMISSION:  09/04/2007  DATE OF DISCHARGE:                              HISTORY & PHYSICAL   CHIEF COMPLAINT:  Shortness of breath and cough.   PRIMARY CARE Rita Burke:  The patient is unassigned, has no primary care  Rita Burke.   HISTORY OF PRESENT ILLNESS:  The patient is a 40 year old female status  post normal spontaneous vaginal delivery on August 08, 2007, who  presents with shortness of breath, chest pain, and cough for 2 days.  The patient describes her chest pain as a throbbing pain that is  substernal that occurs primarily when coughing, not accompanied by  nausea, vomiting, or diaphoresis.  The patient reports the shortness of  breath which started 2 days ago has progressively worsened over the past  2 days.  It is worse when lying down and standing up seems to relieve  somewhat.  It is not related to exercise at all.  Additionally, the  patient has had a cough present for 7 to 10 days, but with a history of  no other upper respiratory symptoms.  No congestion, rhinorrhea, or sore  throat, is productive of yellow sputum.  Cough is worsened over the past  week.  The patient also reports some lower extremity edema.  This began,  however, in her seventh month of pregnancy, noted in her OB history and  physical to be about 2+ to 3+ pitting edema, but she reports this has  improved since her delivery.  Additionally, the patient reports some  episodes of paroxysmal nocturnal dyspnea for the past 2 days and three-  pillow orthopnea.  Also of note, the patient drove herself to the  emergency department.  When she arrived at the emergency department, she  reported her chest pain to be 5 to 6 out of 10.  She was given  nitroglycerin 3 doses, which eventually reduced the pain to 0/10.   REVIEW OF SYSTEMS:  Positive as above.  Negative for fevers, headaches,  palpitation, dry skin, neurological changes, changes in bowel or bladder  habits, nausea, vomiting, and diarrhea.  In the emergency department,  the patient received a chest x-ray, supplemental oxygen, sublingual  nitroglycerin x3, Lasix 40 mg x1, Zofran 4 mg x1, and cardiology was  consulted while she was in the emergency department.   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  She states that she has had no formal work up for this,  but that she has had some blood tests done in 2004, which were within  normal limits.  The patient also reports that this mass has gotten  larger since that time particularly in the last 10 months.   MEDICATIONS:  She takes Micronor  1 tablet daily.  Of note, the patient  is no longer breast feeding and she takes iron 325 mg 1 tablet daily.   ALLERGIES:  No known drug allergies.   FAMILY MEDICAL HISTORY:  Includes diabetes, heart problems on the  father's side, but the patient is not sure what specific heart problems.  The patient has 2 sisters who are healthy.   SOCIAL HISTORY:  No smoking, occasional alcohol.  No illicit drugs.  The  patient lives with her 3 children and her boyfriend.  She works as a  Associate Professor.   PHYSICAL EXAMINATION:  VITAL SIGNS:  Temperature of 97.9 to 98.0,  respirations 18 to 22, pulse 108 to 120, blood pressure 131 to 164 over  90 to 113, and oxygen saturation 97% on 2 L.  GENERAL:  The patient is in no acute distress, resting comfortably on 2  L of oxygen.  HEENT:  The patient was normocephalic, atraumatic.  Pupils equally  round, and reactive to light.  Oropharynx was clear.  Moist mucous  membranes.  No exophthalmos.  NECK:  The patient had a large nontender thyroid  mass on her neck, right  side much greater than left side.  No discrete nodules were felt.  No  JVD was noted.  CARDIOVASCULAR:  The patient was tachycardiac with a regular rhythm.  No  murmurs, rubs, or gallops detected.  PULMONARY:  The patient was clear to auscultation bilaterally.  ABDOMEN:  The patient's abdomen was soft, nontender, and nondistended  with positive bowel sounds.  No hepatosplenomegaly.  EXTREMITIES:  The patient was noted to have 1+ pitting edema in the  bilateral extremities, but no pretibial edema.  NEUROLOGIC:  The patient was alert and oriented x3.  Had 5/5 strength in  upper and lower extremities.   LABORATORY DATA:  Included a BNP which is found to be elevated at 1463.  Point-of-care cardiac enzymes showed a myoglobin of 34.7, a CK-MB of  1.3, and a troponin of less than 0.05.  CBC was within normal limits  with a white blood cell count of 7.0, hemoglobin of 14.0, hematocrit  42.7, and platelets 176.  Differential was within normal limits.  Basic  metabolic panel showed a sodium of 140, potassium of 3.5, chloride of  106, bicarb of 26, BUN of 6, creatinine 0.87, glucose of 113, total  protein 7.1, albumin 3.7, AST 17, ALT 14, calcium 8.9, total bilirubin  1.1, and alkaline phosphatase 71.  UA showed large hemoglobin, small  leukocyte esterase, and rare bacteria.  PT was 13.5, INR was 1.0, and  PTT was 26.  A D-dimer was elevated at 1.99.  Chest x-ray showed cardiac  enlargement and edema consistent with CHF, but could not exclude  coexistent bronchitis.  EKG showed sinus tachycardia, left atrial  enlargement, and T-wave inversion in aVL, V1, V5, and V6.   ASSESSMENT/PLAN:  The patient is a 40 year old G5, P 3-0-2-3, status  post normal spontaneous vaginal delivery on August 08, 2007, admitted  for cough, shortness of breath, lower extremity edema concerning for  postpartum cardiomyopathy.  1. Cardiovascular:  Cardiology has already seen and examined the       patient.  We will follow their recommendations.  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  We will      hold off on the chest CT angiography for now until  we confirm that      the thyroid studies are grossly normal as contrast would be      detrimental in the setting of hyperthyroidism.  We will start      Lovenox at treatment dose until the thyroid studies are back, and      we decide whether or not to get the CT.  We will check a 2D      echocardiogram, we will cycle cardiac enzymes, place the patient in      a telemetry bed, and we will check serial EKGs.  We appreciate      cardiology's care for this patient.  The patient also had increased      blood pressure, but we will hold off for now as we do not want to      start her on a beta blocker if she is in a congestive heart failure      exacerbation.  We will also start her on aspirin.  2. Thyroid mass.  The patient has a long history of a thyroid mass      with an increase in size particularly over the past 10 months.  As      stated previously, the patient reports that the blood tests done in      2004 were normal, but she states she was told they were      borderline.  These tests were done at her obstetrician office;      however, the patient reports no other work up has been done.  We      will check thyroid studies and then evaluate either with an      ultrasound or CT.  Some of this may depend on if we are getting a      CT angiography for her shortness of breath.  3. Gynecologic.  The patient is on Micronor, we will continue this for      now; however, the patient is no longer breastfeeding; so at some      point, it may be better to switch her to combination of oral      contraceptive at discharge.  However, this is not a pressing issue      to settle at this time.  4. No primary care Aneliese Beaudry.  The patient does need  a primary care      Sahily Biddle.  We will order social work consult      to address this.  Cough, we will give Tessalon Perles.  Cough could      be cardiac or of upper respiratory infection.  5. Disposition.  Pending work up results and clinical improvement.      Asher Muir, MD  Electronically Signed      Nestor Ramp, MD  Electronically Signed    SO/MEDQ  D:  09/05/2007  T:  09/06/2007  Job:  [id number]

## 2010-12-07 NOTE — Assessment & Plan Note (Signed)
[facility]                            CARDIOLOGY OFFICE NOTE   SHIARA, MCGOUGH                         MRN:          [id number]  DATE:10/02/2007                            DOB:          October 25, 1970    PRIMARY CARE PHYSICIAN:  Asher Muir, MD, at the [facility].   REASON FOR VISIT:  Follow up peripartum cardiomyopathy.   HISTORY OF PRESENT ILLNESS:  I saw Ms. Berent back in February with  shortness of breath following a normal vaginal delivery in mid January.  She was ultimately diagnosed with a cardiomyopathy, ejection fraction of  25%, and associated with trivial aortic regurgitation, mild mitral  regurgitation had a mildly dilated left atrium.  She had a CT scan of  the chest done around that time, which excluded a pulmonary embolus, and  also further imaging of an enlarged right thyroid gland.  This was  ultimately biopsied and she tells me that no cancerous lesions were  found.  I presume that she was diagnosed with a goiter.  From the  perspective of her myopathy, she is not reporting any significant chest  pain and has NYHA class I dyspnea on exertion.  She also denies  orthopnea, lower extremity edema and PND.  Today's electrocardiogram  shows sinus rhythm at 61 beats per minute with left atrial enlargement  and nonspecific ST-T wave changes, predominantly in the inferolateral  leads.  She reports compliance with her medications and is due for  follow-up blood work today.   ALLERGIES:  No known drug allergies.   PRESENT MEDICATIONS:  1. Lisinopril 5 mg p.o. daily.  2. Carvedilol 6.25 mg p.o. b.i.d.  3. Spirolactone 25 mg p.o. daily.   REVIEW OF SYSTEMS:  As described in the history of present illness.  Otherwise negative.   EXAMINATION:  Blood pressure is 130/79, heart rate is 69, weight is 179  pounds.  The patient is comfortable, in no acute distress.  HEENT:  Conjunctivae and lids normal.  Pharynx is  clear.  NECK:  Supple.  No elevated jugular venous pressure.  Enlarged right  thyroid again noted.  This area is firm and nontender.  LUNGS:  Clear without labored breathing at rest.  CARDIAC:  A regular rate and rhythm.  No S3 gallop or pericardial rub.  ABDOMEN:  Soft, nontender, normoactive bowel sounds.  EXTREMITIES:  No significant pitting edema.  Distal pulses are 2+.  SKIN:  Warm and dry.  MUSCULOSKELETAL:  No kyphosis noted.  NEUROPSYCHIATRIC:  The patient is alert and oriented x3.  Affect is  appropriate.   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  Symptomatically the patient is quite stable  on the present medical regimen.  I plan a follow-up BMET and BNP and we  will see her back over the next 3 months with a repeat echocardiogram.  Hopefully, she will return to normal function over time.  I counseled  her once again that she should absolutely avoid repeat pregnancy.  She  states that she understands this and  is using contraceptive measures.     Jonelle Sidle, MD  Electronically Signed    SGM/MedQ  DD: 10/02/2007  DT: 10/03/2007  Job #: [id number]   cc:   Asher Muir, MD

## 2010-12-10 NOTE — H&P (Signed)
NAMEJANYLA, BISCOE                          ACCOUNT NO.:  [id number]   MEDICAL RECORD NO.:  [id number]                   PATIENT TYPE:  INP   LOCATION:  9118                                 FACILITY:  [facility]   PHYSICIAN:  Naima A. Dillard, M.D.              DATE OF BIRTH:  [birth year]   DATE OF ADMISSION:  05/01/2003  DATE OF DISCHARGE:                                HISTORY & PHYSICAL   HISTORY OF PRESENT ILLNESS:  This is a 40 year old gravida 2 para 1-0-0-1 at  45 and zero-sevenths weeks who presents with regular uterine contractions  since 5 a.m.  She denies leaking, bleeding, reports positive fetal movement.  Pregnancy has been followed by the nurse midwife service and remarkable for:  1. Late to care.  2. Right thyroid nodule with normal thyroid studies.  3. Equivocal rubella.  4. Group B strep negative.   OBSTETRICAL HISTORY:  Remarkable for vaginal delivery in 1990 of a female  infant at [redacted] weeks gestation weighing 7 pounds 12 ounces with no  complications.   MEDICAL HISTORY:  Remarkable for childhood varicella.   SURGICAL HISTORY:  Remarkable for wisdom teeth extraction in 2003.   FAMILY HISTORY:  Remarkable for a father with hypertension and diabetes and  a mother with nicotine use.   GENETIC HISTORY:  Unremarkable.   SOCIAL HISTORY:  The patient is single but father of the baby, Karlyne Greenspan, is involved and supportive.  She does not report a religious  affiliation.  She denies any alcohol, tobacco, or drug use.   OBJECTIVE DATA:  VITAL SIGNS:  Stable, afebrile.  HEENT:  Within normal limits.  Thyroid normal except for a 2 cm mass on the  right side.  CHEST:  Clear to auscultation.  HEART:  Regular rate and rhythm.  ABDOMEN:  Gravid, vertex to Leopold's.  EFM shows a reassuring fetal heart  rate with uterine contractions every two minutes.  PELVIC:  Cervical exam is 8 cm, completely effaced, -1 station, with a  vertex presentation, intact membranes which are  bulging.  EXTREMITIES:  Within normal limits.   PRENATAL LABORATORY DATA:  Hemoglobin 11.8, platelets 187.  Blood type A  positive, antibody screen negative.  Sickle cell negative.  RPR negative.  Rubella equivocal.  HbsAg negative.  HIV nonreactive.  Pap test normal.  Gonorrhea negative, chlamydia negative.  TSH 1.051.  Quad screen within  normal limits.  Group B strep negative.   ASSESSMENT:  1. Intrauterine pregnancy at 28 and zero-sevenths weeks.  2. Active labor, transition.  3. Group B strep negative.   PLAN:  1. Admit to birthing suites, routine C.N.M. orders.  2. Dr. Normand Sloop notified.  3. Anticipate SVD.     Marie L. Williams, C.N.M.                 Naima A. Normand Sloop, M.D.    MLW/MEDQ  D:  05/01/2003  T:  05/01/2003  Job:  [id number]

## 2010-12-10 NOTE — Op Note (Signed)
Rita Burke, EGLE NO.:  [id number]   MEDICAL RECORD NO.:  [id number]          PATIENT TYPE:  AMB   LOCATION:  SDC                           FACILITY:  [facility]   PHYSICIAN:  Janine Limbo, M.D.DATE OF BIRTH:  05/15/1971   DATE OF PROCEDURE:  07/14/2004  DATE OF DISCHARGE:                                 OPERATIVE REPORT   PREOPERATIVE DIAGNOSES:  1.  First trimester missed abortion.  2.  Heavy vaginal bleeding.  3.  Chorioamnionitis.   POSTOPERATIVE DIAGNOSES:  1.  First trimester missed abortion.  2.  Heavy vaginal bleeding.  3.  Chorioamnionitis.   PROCEDURE:  Suction dilatation and evacuation.   SURGEON:  Janine Limbo, M.D.   ANESTHESIA:  Monitored anesthesia control, paracervical block using 0.5%  Marcaine with epinephrine.   DISPOSITION:  [name] is a 40 year old female, gravida 3, para 2-0-0-2,  who presents with decreasing quantitative beta HCG values and no viable  intrauterine gestational on ultrasound.  On July 13, 2004, the patient  noted that her bleeding started to increase.  Her bleeding became extremely  heavy today.  She noted a very foul odor from the vagina.  She was seen in  the office this morning and was noted to have large blood clots and very  heavy bleeding from the vagina.  She was brought to the [facility] for an emergent suction dilatation and curettage.  Her hemoglobin  was 10.5 in the office. Her preoperative hemoglobin at [facility] was  8.5.  We reviewed the indications for her surgical procedure.  The risks  were discussed, including anesthetic complications, bleeding, infections,  and possible damage to the surrounding organs.   FINDINGS:  The patient had an eight to 10-week size uterus.  There was a 75  mL blood clot in the vagina upon entering the operating room.  The cervix  was dilated.  There was a small amount of active bleeding present.  The  patient's blood type is A  positive.  A large amount of products of  conception were removed from within the uterus.   PROCEDURE:  The patient was taken to the operating room, where she was given  medication through her IV line.  The patient's perineum and vagina were  prepped with multiple layers of Betadine.  The bladder was drained of urine.  Examination under anesthesia was performed.  The patient was sterilely  draped.  A speculum was placed in the vagina and a paracervical block was  placed using 10 mL of 0.5% Marcaine with epinephrine.  Products of  conception were noted within the cervical os.  These were removed using ring  forceps.  The cavity was then explored and emptied using a size 8 suction  curette, followed by a medium sharp curette.  Hemostasis was adequate at the  end of our procedure. The cavity was felt to be clean.  All instruments were  removed.  The examination was repeated and the cervix was noted to be eight  weeks' size and more firm.  She was awakened from her  anesthetic and then  taken to the recovery room in stable condition.  The patient received 1 g of  Ancef in the operating room as well as 30 mg of Toradol IV in the operating  room.   FOLLOW-UP INSTRUCTIONS:  The patient was given a prescription for Vicodin,  and she will take one or two tablets every four hours as needed for pain.  She was given a prescription for doxycycline, and she will take one tablet  twice each day for 10 days.  She will return to see Dr. Stefano Gaul in two to  three weeks for follow-up examination.  She was given a copy of the  postoperative instruction sheet as prepared by the [facility]  [facility] for patients who have undergone a dilatation and curettage.  She  was told to call for questions or concerns.     Arth   AVS/MEDQ  D:  07/14/2004  T:  07/14/2004  Job:  [id number]

## 2011-04-13 LAB — CBC
HCT: 31.9 — ABNORMAL LOW
Hemoglobin: 11 — ABNORMAL LOW
MCHC: 34.6
MCV: 87.2
Platelets: 136 — ABNORMAL LOW
RBC: 3.65 — ABNORMAL LOW
RDW: 14.4
WBC: 7

## 2011-04-13 LAB — RPR: RPR Ser Ql: NONREACTIVE

## 2011-04-14 LAB — CBC
MCHC: 33.8
RBC: 2.91 — ABNORMAL LOW
WBC: 10.3

## 2011-04-15 LAB — ETHANOL: Alcohol, Ethyl (B): 7

## 2011-04-15 LAB — CBC
HCT: 39.4
Hemoglobin: 14
MCHC: 32.8
MCV: 88.1
MCV: 92.9
Platelets: 144 — ABNORMAL LOW
Platelets: 155
RBC: 3.83 — ABNORMAL LOW
RBC: 4.4
RDW: 14.7
WBC: 4.1
WBC: 6.2

## 2011-04-15 LAB — POCT CARDIAC MARKERS
CKMB, poc: 1.3
Myoglobin, poc: 34.7
Operator id: 295021
Troponin i, poc: <0.05

## 2011-04-15 LAB — BASIC METABOLIC PANEL
BUN: 10
CO2: 26
CO2: 26
Chloride: 100
Chloride: 105
Chloride: 106
Creatinine, Ser: 0.9
Creatinine, Ser: 0.9
GFR calc Af Amer: >60
GFR calc Af Amer: >60
GFR calc Af Amer: >60
GFR calc non Af Amer: >60
Potassium: 3.3 — ABNORMAL LOW
Potassium: 3.4 — ABNORMAL LOW
Potassium: 3.6

## 2011-04-15 LAB — URINALYSIS, ROUTINE W REFLEX MICROSCOPIC
Glucose, UA: NEGATIVE
Protein, ur: NEGATIVE
pH: 6.5

## 2011-04-15 LAB — DIFFERENTIAL
Basophils Absolute: 0.1
Basophils Relative: 1
Eosinophils Absolute: 0.1
Eosinophils Relative: 1
Monocytes Absolute: 0.4
Neutro Abs: 4.5

## 2011-04-15 LAB — COMPREHENSIVE METABOLIC PANEL
AST: 17
Albumin: 3.7
BUN: 6
Calcium: 8.9
Creatinine, Ser: 0.87
GFR calc Af Amer: >60

## 2011-04-15 LAB — DRUGS OF ABUSE SCREEN W/O ALC, ROUTINE URINE
Amphetamine Screen, Ur: NEGATIVE
Benzodiazepines.: NEGATIVE
Creatinine,U: 84.6
Opiate Screen, Urine: NEGATIVE
Propoxyphene: NEGATIVE

## 2011-04-15 LAB — URINE MICROSCOPIC-ADD ON

## 2011-04-15 LAB — TSH: TSH: 1.728

## 2011-04-15 LAB — CARDIAC PANEL(CRET KIN+CKTOT+MB+TROPI)
CK, MB: 1.4
Relative Index: INVALID
Troponin I: 0.04

## 2011-04-15 LAB — LIPID PANEL
Cholesterol: 167
HDL: 62
LDL Cholesterol: 96
Total CHOL/HDL Ratio: 2.7
Triglycerides: 45

## 2011-04-15 LAB — PROTIME-INR
INR: 1
Prothrombin Time: 13.5

## 2011-04-15 LAB — THC (MARIJUANA), URINE, CONFIRMATION: Marijuana, Ur-Confirmation: 40 ng/mL

## 2011-04-15 LAB — I-STAT 8, (EC8 V) (CONVERTED LAB)
Bicarbonate: 25.7 — ABNORMAL HIGH
Glucose, Bld: 99
Sodium: 140
TCO2: 27
pCO2, Ven: 46.7
pH, Ven: 7.349 — ABNORMAL HIGH

## 2011-04-15 LAB — T4, FREE: Free T4: 1.11

## 2011-04-15 LAB — TROPONIN I: Troponin I: 0.06

## 2011-04-15 LAB — HIV ANTIBODY (ROUTINE TESTING W REFLEX): HIV: NONREACTIVE

## 2011-04-15 LAB — POCT I-STAT CREATININE: Creatinine, Ser: 1

## 2011-04-15 LAB — CK TOTAL AND CKMB (NOT AT ARMC): Total CK: 149

## 2011-05-13 ENCOUNTER — Other Ambulatory Visit: Payer: Self-pay | Admitting: Obstetrics & Gynecology

## 2011-05-13 DIAGNOSIS — Z1231 Encounter for screening mammogram for malignant neoplasm of breast: Secondary | ICD-10-CM

## 2011-06-06 ENCOUNTER — Ambulatory Visit (HOSPITAL_COMMUNITY)
Admission: RE | Admit: 2011-06-06 | Discharge: 2011-06-06 | Disposition: A | Discharge status: Home / Self Care | Payer: Self-pay | Source: Ambulatory Visit | Attending: Obstetrics & Gynecology | Admitting: Obstetrics & Gynecology

## 2011-06-06 DIAGNOSIS — Z1231 Encounter for screening mammogram for malignant neoplasm of breast: Secondary | ICD-10-CM

## 2020-09-10 ENCOUNTER — Emergency Department (HOSPITAL_COMMUNITY): Payer: Self-pay

## 2020-09-10 ENCOUNTER — Other Ambulatory Visit: Payer: Self-pay

## 2020-09-10 ENCOUNTER — Emergency Department (HOSPITAL_COMMUNITY)
Admission: EM | Admit: 2020-09-10 | Discharge: 2020-09-10 | Disposition: A | Discharge status: Home / Self Care | Payer: Self-pay | Attending: Emergency Medicine | Admitting: Emergency Medicine

## 2020-09-10 ENCOUNTER — Encounter (HOSPITAL_COMMUNITY): Payer: Self-pay

## 2020-09-10 DIAGNOSIS — R109 Unspecified abdominal pain: Secondary | ICD-10-CM

## 2020-09-10 DIAGNOSIS — R112 Nausea with vomiting, unspecified: Secondary | ICD-10-CM | POA: Insufficient documentation

## 2020-09-10 DIAGNOSIS — R1013 Epigastric pain: Secondary | ICD-10-CM | POA: Insufficient documentation

## 2020-09-10 LAB — URINALYSIS, ROUTINE W REFLEX MICROSCOPIC
Bilirubin Urine: NEGATIVE
Glucose, UA: NEGATIVE mg/dL
Hgb urine dipstick: NEGATIVE
Ketones, ur: 80 mg/dL — AB
Leukocytes,Ua: NEGATIVE
Nitrite: NEGATIVE
Protein, ur: 30 mg/dL — AB
Specific Gravity, Urine: 1.028 (ref 1.005–1.030)
pH: 9 — ABNORMAL HIGH (ref 5.0–8.0)

## 2020-09-10 LAB — COMPREHENSIVE METABOLIC PANEL
ALT: 18 U/L (ref 0–44)
AST: 24 U/L (ref 15–41)
Albumin: 3.9 g/dL (ref 3.5–5.0)
Alkaline Phosphatase: 50 U/L (ref 38–126)
Anion gap: 15 (ref 5–15)
BUN: 10 mg/dL (ref 6–20)
CO2: 19 mmol/L — ABNORMAL LOW (ref 22–32)
Calcium: 9.4 mg/dL (ref 8.9–10.3)
Chloride: 102 mmol/L (ref 98–111)
Creatinine, Ser: 1.01 mg/dL — ABNORMAL HIGH (ref 0.44–1.00)
GFR, Estimated: >60 mL/min (ref >60)
Glucose, Bld: 166 mg/dL — ABNORMAL HIGH (ref 70–99)
Potassium: 3.3 mmol/L — ABNORMAL LOW (ref 3.5–5.1)
Sodium: 136 mmol/L (ref 135–145)
Total Bilirubin: 0.8 mg/dL (ref 0.3–1.2)
Total Protein: 7.1 g/dL (ref 6.5–8.1)

## 2020-09-10 LAB — TROPONIN I (HIGH SENSITIVITY)
Troponin I (High Sensitivity): 6 ng/L (ref <18)
Troponin I (High Sensitivity): 5 ng/L (ref <18)

## 2020-09-10 LAB — LIPASE, BLOOD: Lipase: 28 U/L (ref 11–51)

## 2020-09-10 LAB — CBC
HCT: 40.1 % (ref 36.0–46.0)
Hemoglobin: 13 g/dL (ref 12.0–15.0)
MCH: 27.7 pg (ref 26.0–34.0)
MCHC: 32.4 g/dL (ref 30.0–36.0)
MCV: 85.5 fL (ref 80.0–100.0)
Platelets: 135 10*3/uL — ABNORMAL LOW (ref 150–400)
RBC: 4.69 MIL/uL (ref 3.87–5.11)
RDW: 14.6 % (ref 11.5–15.5)
WBC: 7.4 10*3/uL (ref 4.0–10.5)
nRBC: 0 % (ref 0.0–0.2)

## 2020-09-10 LAB — I-STAT BETA HCG BLOOD, ED (MC, WL, AP ONLY): I-stat hCG, quantitative: <5 m[IU]/mL (ref <5)

## 2020-09-10 MED ORDER — MORPHINE SULFATE (PF) 4 MG/ML IV SOLN
4.0000 mg | Freq: Once | INTRAVENOUS | Status: AC
  Administered 2020-09-10: 4 mg via INTRAVENOUS
  Filled 2020-09-10: qty 1

## 2020-09-10 MED ORDER — ONDANSETRON 8 MG PO TBDP: 8.0000 mg | ORAL_TABLET | Freq: Three times a day (TID) | ORAL | 0 refills | Status: DC | PRN

## 2020-09-10 MED ORDER — OMEPRAZOLE 20 MG PO CPDR: 20.0000 mg | DELAYED_RELEASE_CAPSULE | Freq: Two times a day (BID) | ORAL | 0 refills | Status: DC

## 2020-09-10 MED ORDER — ONDANSETRON HCL 4 MG/2ML IJ SOLN
4.0000 mg | Freq: Once | INTRAMUSCULAR | Status: AC
  Administered 2020-09-10: 4 mg via INTRAVENOUS
  Filled 2020-09-10: qty 2

## 2020-09-10 NOTE — Discharge Instructions (Addendum)
Take the medications as prescribed.  Follow-up with your doctor for further evaluation if the symptoms persist.  Return to the ED for fevers chills or other worsening symptoms

## 2020-09-10 NOTE — ED Notes (Signed)
Pt is in radiology.

## 2020-09-10 NOTE — ED Provider Notes (Signed)
[facility] EMERGENCY DEPARTMENT Provider Note   CSN: [id number] Arrival date & time: 09/10/20  0522     History Chief Complaint  Patient presents with  . Abdominal Pain    Rita Burke is a 50 y.o. female.  HPI Patient presents to the ED with complaints of abdominal pain.  Patient states the pain is in the middle of her upper abdomen.  It severe pain that is constant.  It feels like there is a knot in her epigastric area.  She has had multiple episodes of nausea and vomiting.  This all started around 5 PM last evening.  Patient states she did have a quesadilla last evening.  She does not usually have any particular trouble with foods.  She denies any fevers or chills.  She denies any diarrhea or constipation.  Patient has not had any prior abdominal surgeries.  She has had one episode like this in the past and told it was related to food poisoning.  She is not having any shortness of breath.  She has not had any urinary symptoms.    History reviewed. No pertinent past medical history.  Patient Active Problem List   Diagnosis Date Noted  . GOITER, UNSPECIFIED 09/17/2007  . PERIPARTUM CARDIOMYOPATHY POSTPARTUM COND/COMP 09/17/2007    History reviewed. No pertinent surgical history.   OB History   No obstetric history on file.     History reviewed. No pertinent family history.     Home Medications Prior to Admission medications   Medication Sig Start Date End Date Taking? Authorizing Provider  omeprazole (PRILOSEC) 20 MG capsule Take 1 capsule (20 mg total) by mouth 2 (two) times daily before a meal. 09/10/20  Yes Linwood Dibbles, MD  ondansetron (ZOFRAN ODT) 8 MG disintegrating tablet Take 1 tablet (8 mg total) by mouth every 8 (eight) hours as needed for nausea or vomiting. 09/10/20  Yes Linwood Dibbles, MD  carvedilol (COREG) 6.25 MG tablet Take 6.25 mg by mouth 2 (two) times daily with meals.      [provider]  lisinopril (PRINIVIL,ZESTRIL) 5 MG  tablet Take 5 mg by mouth daily.      [provider]  spironolactone (ALDACTONE) 25 MG tablet Take 25 mg by mouth daily.      [provider]    Allergies    Patient has no known allergies.  Review of Systems   Review of Systems  All other systems reviewed and are negative.   Physical Exam Updated Vital Signs BP 117/60   Pulse 66   Temp 98.6 F (37 C) (Oral)   Resp 17   Ht 1.689 m (5' 6.5")   Wt 80.3 kg   LMP 09/07/2020   SpO2 92%   BMI 28.15 kg/m   Physical Exam Vitals and nursing note reviewed.  Constitutional:      Appearance: She is well-developed and well-nourished. She is ill-appearing.     Comments: Appears to be in pain  HENT:     Head: Normocephalic and atraumatic.     Right Ear: External ear normal.     Left Ear: External ear normal.  Eyes:     General: No scleral icterus.       Right eye: No discharge.        Left eye: No discharge.     Conjunctiva/sclera: Conjunctivae normal.  Neck:     Trachea: No tracheal deviation.  Cardiovascular:     Rate and Rhythm: Normal rate and regular rhythm.  Pulses: Intact distal pulses.  Pulmonary:     Effort: Pulmonary effort is normal. No respiratory distress.     Breath sounds: Normal breath sounds. No stridor. No wheezing or rales.  Abdominal:     General: Bowel sounds are normal. There is no distension.     Palpations: Abdomen is soft.     Tenderness: There is abdominal tenderness in the epigastric area. There is guarding. There is no rebound. Negative signs include Murphy's sign.  Musculoskeletal:        General: No tenderness or edema.     Cervical back: Neck supple.  Skin:    General: Skin is warm and dry.     Findings: No rash.  Neurological:     Mental Status: She is alert.     Cranial Nerves: No cranial nerve deficit (no facial droop, extraocular movements intact, no slurred speech).     Sensory: No sensory deficit.     Motor: No abnormal muscle tone or seizure activity.      Coordination: Coordination normal.     Deep Tendon Reflexes: Strength normal.  Psychiatric:        Mood and Affect: Mood and affect normal.     ED Results / Procedures / Treatments   Labs (all labs ordered are listed, but only abnormal results are displayed) Labs Reviewed  COMPREHENSIVE METABOLIC PANEL - Abnormal; Notable for the following components:      Result Value   Potassium 3.3 (*)    CO2 19 (*)    Glucose, Bld 166 (*)    Creatinine, Ser 1.01 (*)    All other components within normal limits  CBC - Abnormal; Notable for the following components:   Platelets 135 (*)    All other components within normal limits  URINALYSIS, ROUTINE W REFLEX MICROSCOPIC - Abnormal; Notable for the following components:   pH 9.0 (*)    Ketones, ur 80 (*)    Protein, ur 30 (*)    Bacteria, UA RARE (*)    All other components within normal limits  LIPASE, BLOOD  I-STAT BETA HCG BLOOD, ED ([facility], [facility], [facility] ONLY)  TROPONIN I (HIGH SENSITIVITY)  TROPONIN I (HIGH SENSITIVITY)    EKG EKG Interpretation  Date/Time:  Thursday September 10 2020 10:09:24 EST Ventricular Rate:  66 PR Interval:    QRS Duration: 88 QT Interval:  435 QTC Calculation: 456 R Axis:   76 Text Interpretation: Sinus rhythm Left atrial enlargement Nonspecific T abnormalities, lateral leads t wave inversion in lateral leads on previous ECG are no longer present Confirmed by Linwood Dibbles [phone]) on 09/10/2020 10:35:04 AM   Radiology DG Chest 2 View  Result Date: 09/10/2020 CLINICAL DATA:  Chest pain.  Nausea and vomiting EXAM: CHEST - 2 VIEW COMPARISON:  09/05/2007 FINDINGS: Normal heart size and mediastinal contours. Rotated frontal view. No acute infiltrate or edema. No effusion or pneumothorax. No acute osseous findings. IMPRESSION: No active cardiopulmonary disease. Electronically Signed   By: Marnee Spring M.D.   On: 09/10/2020 06:12   US Abdomen Complete  Result Date: 09/10/2020 CLINICAL DATA:  Abdominal pain, vomiting  EXAM: ABDOMEN ULTRASOUND COMPLETE COMPARISON:  None. FINDINGS: Gallbladder: No gallstones or wall thickening visualized. No sonographic Murphy sign noted by sonographer. Common bile duct: Diameter: Normal caliber, 3 mm. Liver: No focal lesion identified. Within normal limits in parenchymal echogenicity. Portal vein is patent on color Doppler imaging with normal direction of blood flow towards the liver. IVC: No abnormality visualized. Pancreas: Visualized portion  unremarkable. Spleen: Size and appearance within normal limits. Right Kidney: Length: 12 cm. Echogenicity within normal limits. No mass or hydronephrosis visualized. Left Kidney: Length: 9.8 cm. Echogenicity within normal limits. No mass or hydronephrosis visualized. Abdominal aorta: No aneurysm visualized. Other findings: None. IMPRESSION: Normal abdominal ultrasound. Electronically Signed   By: Charlett Nose M.D.   On: 09/10/2020 10:13   DG Abdomen Acute W/Chest  Result Date: 09/10/2020 CLINICAL DATA:  Abdominal pain, vomiting EXAM: DG ABDOMEN ACUTE WITH 1 VIEW CHEST COMPARISON:  03/21/2010 FINDINGS: There is no evidence of dilated bowel loops or free intraperitoneal air. No radiopaque calculi or other significant radiographic abnormality is seen. Heart size and mediastinal contours are within normal limits. Both lungs are clear. IMPRESSION: Negative abdominal radiographs.  No acute cardiopulmonary disease. Electronically Signed   By: Charlett Nose M.D.   On: 09/10/2020 09:45    Procedures Procedures   Medications Ordered in ED Medications  morphine 4 MG/ML injection 4 mg (4 mg Intravenous Given 09/10/20 1006)  ondansetron (ZOFRAN) injection 4 mg (4 mg Intravenous Given 09/10/20 1005)    ED Course  I have reviewed the triage vital signs and the nursing notes.  Pertinent labs & imaging results that were available during my care of the patient were reviewed by me and considered in my medical decision making (see chart for  details).  Clinical Course as of 09/10/20 1247  Thu Sep 10, 2020  1033 Labs reviewed.  CBC normal.  Lipase normal.  Urinalysis still pending. [JK]  1033 Ultrasound without acute findings. [JK]  1033 X-ray without acute abnormalities. [JK]  1034 Chest x-ray without acute findings. [JK]  1212 Urinalysis is negative for signs of infection or hematuria [JK]    Clinical Course User Index [JK] Linwood Dibbles, MD   MDM Rules/Calculators/A&P                          Patient presented to the ED for evaluation of abdominal pain.  Symptoms were primarily in the upper abdomen.  They are associated with nausea and vomiting.  Patient was treated with pain medications and antinausea medications.  She is feeling much better and is no longer had any abdominal tenderness or vomiting.  Is concerned about the possibility of acute cholecystitis but ultrasound does not show evidence of gallstones or any abnormality.  Patient is not having evidence of pancreatitis on laboratory tests.  X-rays do not show findings to suggest an acute obstruction.  No hematuria or hydronephrosis noted on the ultrasound to suggest ureteral colic.  Symptoms may be related to a viral illness.  It is also possible she could have some gastritis peptic ulcer disease.  Will discharge her home on antacids and antinausea medication.  Discussed outpatient follow-up  Final Clinical Impression(s) / ED Diagnoses Final diagnoses:  Abdominal pain, unspecified abdominal location  Nausea and vomiting, intractability of vomiting not specified, unspecified vomiting type    Rx / DC Orders ED Discharge Orders         Ordered    ondansetron (ZOFRAN ODT) 8 MG disintegrating tablet  Every 8 hours PRN        09/10/20 1246    omeprazole (PRILOSEC) 20 MG capsule  2 times daily before meals        09/10/20 1246           Linwood Dibbles, MD 09/10/20 1248

## 2020-09-10 NOTE — ED Triage Notes (Signed)
Patient arrives from home with abdominal pain with nausea, vomiting, states it started about 5pm last night, reports it feels like a knot in her epigastric area.

## 2022-03-06 ENCOUNTER — Encounter (HOSPITAL_COMMUNITY): Payer: Self-pay | Admitting: Emergency Medicine

## 2022-03-06 ENCOUNTER — Emergency Department (HOSPITAL_COMMUNITY)
Admission: EM | Admit: 2022-03-06 | Discharge: 2022-03-06 | Disposition: A | Discharge status: Home / Self Care | Payer: Self-pay | Attending: Emergency Medicine | Admitting: Emergency Medicine

## 2022-03-06 DIAGNOSIS — R109 Unspecified abdominal pain: Secondary | ICD-10-CM | POA: Insufficient documentation

## 2022-03-06 DIAGNOSIS — R112 Nausea with vomiting, unspecified: Secondary | ICD-10-CM | POA: Insufficient documentation

## 2022-03-06 LAB — CBC
HCT: 42.3 % (ref 36.0–46.0)
Hemoglobin: 14 g/dL (ref 12.0–15.0)
MCH: 28.6 pg (ref 26.0–34.0)
MCHC: 33.1 g/dL (ref 30.0–36.0)
MCV: 86.3 fL (ref 80.0–100.0)
Platelets: 128 10*3/uL — ABNORMAL LOW (ref 150–400)
RBC: 4.9 MIL/uL (ref 3.87–5.11)
RDW: 15.2 % (ref 11.5–15.5)
WBC: 5.8 10*3/uL (ref 4.0–10.5)
nRBC: 0 % (ref 0.0–0.2)

## 2022-03-06 LAB — COMPREHENSIVE METABOLIC PANEL
ALT: 15 U/L (ref 0–44)
AST: 23 U/L (ref 15–41)
Albumin: 4 g/dL (ref 3.5–5.0)
Alkaline Phosphatase: 54 U/L (ref 38–126)
Anion gap: 11 (ref 5–15)
BUN: 12 mg/dL (ref 6–20)
CO2: 20 mmol/L — ABNORMAL LOW (ref 22–32)
Calcium: 9.2 mg/dL (ref 8.9–10.3)
Chloride: 108 mmol/L (ref 98–111)
Creatinine, Ser: 0.91 mg/dL (ref 0.44–1.00)
GFR, Estimated: >60 mL/min (ref >60)
Glucose, Bld: 150 mg/dL — ABNORMAL HIGH (ref 70–99)
Potassium: 3.5 mmol/L (ref 3.5–5.1)
Sodium: 139 mmol/L (ref 135–145)
Total Bilirubin: 0.6 mg/dL (ref 0.3–1.2)
Total Protein: 7.1 g/dL (ref 6.5–8.1)

## 2022-03-06 LAB — LIPASE, BLOOD: Lipase: 30 U/L (ref 11–51)

## 2022-03-06 MED ORDER — DROPERIDOL 2.5 MG/ML IJ SOLN
2.5000 mg | Freq: Once | INTRAMUSCULAR | Status: AC
  Administered 2022-03-06: 2.5 mg via INTRAMUSCULAR
  Filled 2022-03-06: qty 2

## 2022-03-06 MED ORDER — ONDANSETRON 8 MG PO TBDP: 4.0000 mg | ORAL_TABLET | Freq: Three times a day (TID) | ORAL | 0 refills | Status: DC | PRN

## 2022-03-06 NOTE — Discharge Instructions (Addendum)
You were seen in the emergency room today for nausea, vomiting and belly pain.  We did blood work that looked good.  We gave you some medicine that made you feel better.  We think you are safe to go home at this time.  We would like you to fill the prescription for the antinausea pill that dissolves under your tongue and take it as needed only.  If you are unable to keep anything down by mouth or if your abdominal pain worsens please come back to the emergency department.  Otherwise would like you to follow-up with your regular doctor to address these problems moving forward.

## 2022-03-06 NOTE — ED Triage Notes (Signed)
Patient complains of right sided abdominal pain that started this morning, described as a pulling sensation. Patient also reports vomiting.

## 2022-03-06 NOTE — ED Notes (Signed)
Patient verbalizes understanding of discharge instructions. Opportunity for questioning and answers were provided. Armband removed by staff, pt discharged from ED. Pt taken to ED entrance via wheel chair.  

## 2022-03-06 NOTE — ED Provider Notes (Signed)
[facility] EMERGENCY DEPARTMENT Provider Note   CSN: [id number] Arrival date & time: 03/06/22  1148     History  Chief Complaint  Patient presents with   Abdominal Pain    Rita Burke is a 51 y.o. female with no pertinent past medical history presents to the emergency department with several hours of intermittent generalized abdominal pain, nausea and vomiting.  The patient states that she had woken up this morning and normal state of health and had experienced gradual onset generalized abdominal pain without appreciable trigger at that is been intermittent in nature since.  She has had approximately 5 episodes of emesis today.  No associated fever or chills.  She notes that her last bowel movement was approximately 30 minutes prior.  No diarrhea.  No blood in the stool.  No vaginal bleeding or discharge.   Abdominal Pain Associated symptoms: vomiting   Associated symptoms: no chest pain, no chills, no cough, no dysuria, no fever, no hematuria, no shortness of breath and no sore throat      History reviewed. No pertinent past medical history.   Home Medications Prior to Admission medications   Medication Sig Start Date End Date Taking? Authorizing Provider  carvedilol (COREG) 6.25 MG tablet Take 6.25 mg by mouth 2 (two) times daily with meals.      [provider]  lisinopril (PRINIVIL,ZESTRIL) 5 MG tablet Take 5 mg by mouth daily.      [provider]  omeprazole (PRILOSEC) 20 MG capsule Take 1 capsule (20 mg total) by mouth 2 (two) times daily before a meal. 09/10/20   Linwood Dibbles, MD  ondansetron (ZOFRAN ODT) 8 MG disintegrating tablet Take 1 tablet (8 mg total) by mouth every 8 (eight) hours as needed for nausea or vomiting. 09/10/20   Linwood Dibbles, MD  spironolactone (ALDACTONE) 25 MG tablet Take 25 mg by mouth daily.      [provider]      Allergies    Patient has no known allergies.    Review of Systems   Review of Systems   Constitutional:  Negative for chills and fever.  HENT:  Negative for ear pain and sore throat.   Eyes:  Negative for pain and visual disturbance.  Respiratory:  Negative for cough and shortness of breath.   Cardiovascular:  Negative for chest pain and palpitations.  Gastrointestinal:  Positive for abdominal pain and vomiting.  Genitourinary:  Negative for dysuria and hematuria.  Musculoskeletal:  Negative for arthralgias and back pain.  Skin:  Negative for color change and rash.  Neurological:  Negative for seizures and syncope.  All other systems reviewed and are negative.   Physical Exam Updated Vital Signs BP (!) 155/84 (BP Location: Right Arm)   Pulse 67   Temp 98 F (36.7 C) (Oral)   Resp (!) 26   SpO2 100%  Physical Exam Vitals and nursing note reviewed.  Constitutional:      General: She is not in acute distress.    Appearance: She is well-developed.     Comments: Upon entering the exam room, the patient is lying in bed in the fetal position.  She has a blanket from her covering her.  She appears anxious  HENT:     Head: Normocephalic and atraumatic.     Mouth/Throat:     Mouth: Mucous membranes are moist.  Eyes:     Conjunctiva/sclera: Conjunctivae normal.  Cardiovascular:     Rate and Rhythm: Normal rate  and regular rhythm.     Heart sounds: No murmur heard.    Comments: Regular rate and rhythm no murmurs or gallops Pulmonary:     Effort: Pulmonary effort is normal. No respiratory distress.     Breath sounds: Normal breath sounds.     Comments: Lungs are clear to auscultation bilaterally.  Patient speaking in full sentences without difficulty. Abdominal:     Palpations: Abdomen is soft.     Comments: Soft nondistended and nontender.  No flank tenderness.  Musculoskeletal:        General: No swelling.     Cervical back: Neck supple.  Skin:    General: Skin is warm and dry.     Capillary Refill: Capillary refill takes less than 2 seconds.  Neurological:      Mental Status: She is alert.     Comments: Fully alert and oriented moving all extremity spontaneously grossly neurologically intact  Psychiatric:        Mood and Affect: Mood normal.     Comments: Patient appears anxious     ED Results / Procedures / Treatments   Labs (all labs ordered are listed, but only abnormal results are displayed) Labs Reviewed  COMPREHENSIVE METABOLIC PANEL - Abnormal; Notable for the following components:      Result Value   CO2 20 (*)    Glucose, Bld 150 (*)    All other components within normal limits  CBC - Abnormal; Notable for the following components:   Platelets 128 (*)    All other components within normal limits  LIPASE, BLOOD  URINALYSIS, ROUTINE W REFLEX MICROSCOPIC    EKG None  Radiology No results found.  Procedures Procedures    Medications Ordered in ED Medications - No data to display  ED Course/ Medical Decision Making/ A&P                           Medical Decision Making Amount and/or Complexity of Data Reviewed Labs: ordered.  Risk Prescription drug management.   Patient presents the emergency department hemodynamically stable, afebrile and with several hours of intermittent vague abdominal pain with several episodes of nausea and vomiting and a benign exam.  Differential diagnosis includes viral enteritis versus cannabinoid hyperemesis syndrome versus less likely acute surgical pathology including abscess versus appendicitis given age and benign abdominal exam.  I have personally reviewed and interpreted the patient's laboratory work-up which was grossly unremarkable.  No obstructive biliary phenomenon.  Lipase is normal.  No AKI.  We will provide droperidol for the patient's nausea and vomiting and reassess with likely plan to discharge.  Do not feel that CT scan of the abdomen and pelvis is indicated at this time.  Serial reassessment reveals the patient remains hemodynamically stable.  She has just ambulated back  from the bathroom.  She states that she has no further episodes of emesis while being here.  She states that she feels better after the droperidol.  Will discharge patient with prescription for Zofran and instructions to follow-up with her primary care physician in the next several days.  She was given strict return precautions for worsening abdominal pain or inability to tolerate oral intake.          Final Clinical Impression(s) / ED Diagnoses Final diagnoses:  Nausea and vomiting, unspecified vomiting type    Rx / DC Orders ED Discharge Orders     None  Duard Brady, MD 03/06/22 1601    Maia Plan, MD 03/09/22 [phone]

## 2022-07-06 ENCOUNTER — Emergency Department (HOSPITAL_COMMUNITY)
Admission: EM | Admit: 2022-07-06 | Discharge: 2022-07-06 | Disposition: A | Discharge status: Home / Self Care | Payer: Medicaid Other | Attending: Emergency Medicine | Admitting: Emergency Medicine

## 2022-07-06 ENCOUNTER — Emergency Department (HOSPITAL_COMMUNITY): Payer: Medicaid Other

## 2022-07-06 DIAGNOSIS — R079 Chest pain, unspecified: Secondary | ICD-10-CM | POA: Insufficient documentation

## 2022-07-06 DIAGNOSIS — M79601 Pain in right arm: Secondary | ICD-10-CM | POA: Diagnosis not present

## 2022-07-06 DIAGNOSIS — Z79899 Other long term (current) drug therapy: Secondary | ICD-10-CM | POA: Insufficient documentation

## 2022-07-06 DIAGNOSIS — I10 Essential (primary) hypertension: Secondary | ICD-10-CM | POA: Diagnosis not present

## 2022-07-06 LAB — I-STAT BETA HCG BLOOD, ED (MC, WL, AP ONLY): I-stat hCG, quantitative: <5 m[IU]/mL (ref <5)

## 2022-07-06 LAB — CBC
HCT: 41.6 % (ref 36.0–46.0)
Hemoglobin: 13.7 g/dL (ref 12.0–15.0)
MCH: 28.7 pg (ref 26.0–34.0)
MCHC: 32.9 g/dL (ref 30.0–36.0)
MCV: 87 fL (ref 80.0–100.0)
Platelets: 152 10*3/uL (ref 150–400)
RBC: 4.78 MIL/uL (ref 3.87–5.11)
RDW: 15.5 % (ref 11.5–15.5)
WBC: 6.1 10*3/uL (ref 4.0–10.5)
nRBC: 0 % (ref 0.0–0.2)

## 2022-07-06 LAB — BASIC METABOLIC PANEL
Anion gap: 9 (ref 5–15)
BUN: 10 mg/dL (ref 6–20)
CO2: 27 mmol/L (ref 22–32)
Calcium: 9.3 mg/dL (ref 8.9–10.3)
Chloride: 104 mmol/L (ref 98–111)
Creatinine, Ser: 0.91 mg/dL (ref 0.44–1.00)
GFR, Estimated: >60 mL/min (ref >60)
Glucose, Bld: 98 mg/dL (ref 70–99)
Potassium: 4.6 mmol/L (ref 3.5–5.1)
Sodium: 140 mmol/L (ref 135–145)

## 2022-07-06 LAB — T4, FREE: Free T4: 0.74 ng/dL (ref 0.61–1.12)

## 2022-07-06 LAB — TSH: TSH: 0.939 u[IU]/mL (ref 0.350–4.500)

## 2022-07-06 LAB — TROPONIN I (HIGH SENSITIVITY)
Troponin I (High Sensitivity): 4 ng/L (ref <18)
Troponin I (High Sensitivity): 4 ng/L (ref <18)

## 2022-07-06 MED ORDER — PREDNISONE 10 MG (21) PO TBPK: ORAL_TABLET | Freq: Every day | ORAL | 0 refills | Status: DC

## 2022-07-06 MED ORDER — CARVEDILOL 3.125 MG PO TABS
6.2500 mg | ORAL_TABLET | Freq: Two times a day (BID) | ORAL | Status: DC
  Filled 2022-07-06: qty 2

## 2022-07-06 MED ORDER — IOHEXOL 350 MG/ML SOLN
50.0000 mL | Freq: Once | INTRAVENOUS | Status: AC | PRN
  Administered 2022-07-06: 50 mL via INTRAVENOUS

## 2022-07-06 MED ORDER — LISINOPRIL 10 MG PO TABS
5.0000 mg | ORAL_TABLET | Freq: Every day | ORAL | Status: DC
  Filled 2022-07-06: qty 1

## 2022-07-06 MED ORDER — IBUPROFEN 400 MG PO TABS
600.0000 mg | ORAL_TABLET | Freq: Once | ORAL | Status: AC
  Administered 2022-07-06: 600 mg via ORAL
  Filled 2022-07-06: qty 1

## 2022-07-06 MED ORDER — PREDNISONE 20 MG PO TABS
60.0000 mg | ORAL_TABLET | Freq: Once | ORAL | Status: DC
  Filled 2022-07-06: qty 3

## 2022-07-06 NOTE — ED Provider Triage Note (Signed)
Emergency Medicine Provider Triage Evaluation Note  Leonette Nutting , a 51 y.o. female  was evaluated in triage.  Pt complains of neck pain with radiculopathy down the right upper extremity.  Also complains of chest pain which has been going on for the past couple days.  Denies other complaints.  Denies shortness of breath.  Review of Systems  Positive: As above Negative: as above  Physical Exam  BP (!) 178/83   Pulse 79   Temp 97.6 F (36.4 C) (Oral)   Resp 16   Ht 5' 6.5" (1.689 m)   Wt 95.3 kg   SpO2 100%   BMI 33.39 kg/m  Gen:   Awake, no distress   Resp:  Normal effort  MSK:   Moves extremities without difficulty  Other:    Medical Decision Making  Medically screening exam initiated at 11:31 AM.  Appropriate orders placed.  Zanna L Hurston was informed that the remainder of the evaluation will be completed by another provider, this initial triage assessment does not replace that evaluation, and the importance of remaining in the ED until their evaluation is complete.     Marita Kansas, PA-C 07/06/22 1132

## 2022-07-06 NOTE — ED Triage Notes (Signed)
Pt reports right sided chest pain for a few days and right sided arm and neck pain for a week. Denies any SOB.

## 2022-07-06 NOTE — ED Provider Notes (Signed)
[facility] EMERGENCY DEPARTMENT Provider Note   CSN: [id number] Arrival date & time: 07/06/22  1050     History  Chief Complaint  Patient presents with   Chest Pain   Arm Pain    Rita Burke is a 51 y.o. female.   Chest Pain Arm Pain Associated symptoms include chest pain.  Patient presents for right-sided chest and right arm pain.  Symptoms have been intermittent over the past week.  She feels that they were worsening over the past several days.  Today, she had a severe episode of pain that was primarily in her right arm.  At the time, pain was 10/10 in severity.  This prompted her to come to the ED.  Currently, pain is resolved.  Patient denies any cardiac history.  Her medical history is only notable for a goiter that was biopsied several years ago.  She has not had any new symptoms in her neck area.  She denies any recent shortness of breath, nausea, dizziness, or lightheadedness.     Home Medications Prior to Admission medications   Medication Sig Start Date End Date Taking? Authorizing Provider  predniSONE (STERAPRED UNI-PAK 21 TAB) 10 MG (21) TBPK tablet Take by mouth daily. Take 6 tabs by mouth daily  for 2 days, then 5 tabs for 2 days, then 4 tabs for 2 days, then 3 tabs for 2 days, 2 tabs for 2 days, then 1 tab by mouth daily for 2 days 07/06/22  Yes Gloris Manchester, MD  carvedilol (COREG) 6.25 MG tablet Take 6.25 mg by mouth 2 (two) times daily with meals.      [provider]  lisinopril (PRINIVIL,ZESTRIL) 5 MG tablet Take 5 mg by mouth daily.      [provider]  omeprazole (PRILOSEC) 20 MG capsule Take 1 capsule (20 mg total) by mouth 2 (two) times daily before a meal. 09/10/20   Linwood Dibbles, MD  ondansetron (ZOFRAN ODT) 8 MG disintegrating tablet Take 0.5 tablets (4 mg total) by mouth every 8 (eight) hours as needed for up to 12 doses for nausea or vomiting. 03/06/22   Duard Brady, MD  spironolactone (ALDACTONE) 25 MG tablet Take  25 mg by mouth daily.      [provider]      Allergies    Penicillins    Review of Systems   Review of Systems  Cardiovascular:  Positive for chest pain.  Musculoskeletal:  Positive for arthralgias.  All other systems reviewed and are negative.   Physical Exam Updated Vital Signs BP 128/82   Pulse 62   Temp 97.6 F (36.4 C) (Oral)   Resp 19   Ht 5' 6.5" (1.689 m)   Wt 95.3 kg   SpO2 100%   BMI 33.39 kg/m  Physical Exam Vitals and nursing note reviewed.  Constitutional:      General: She is not in acute distress.    Appearance: She is well-developed. She is not ill-appearing, toxic-appearing or diaphoretic.  HENT:     Head: Normocephalic and atraumatic.  Eyes:     Extraocular Movements: Extraocular movements intact.     Conjunctiva/sclera: Conjunctivae normal.  Cardiovascular:     Rate and Rhythm: Normal rate and regular rhythm.     Heart sounds: No murmur heard.    No friction rub. No gallop.  Pulmonary:     Effort: Pulmonary effort is normal. No tachypnea or respiratory distress.     Breath sounds: Normal breath sounds. No  decreased breath sounds, wheezing, rhonchi or rales.  Chest:     Chest wall: No tenderness.  Abdominal:     Palpations: Abdomen is soft.     Tenderness: There is no abdominal tenderness.  Musculoskeletal:        General: No swelling.     Cervical back: Normal range of motion and neck supple.     Right lower leg: No edema.     Left lower leg: No edema.  Skin:    General: Skin is warm and dry.     Coloration: Skin is not cyanotic or pale.  Neurological:     General: No focal deficit present.     Mental Status: She is alert and oriented to person, place, and time.     Cranial Nerves: No cranial nerve deficit.     Motor: No weakness.  Psychiatric:        Mood and Affect: Mood normal.        Behavior: Behavior normal.     ED Results / Procedures / Treatments   Labs (all labs ordered are listed, but only abnormal results  are displayed) Labs Reviewed  CBC  BASIC METABOLIC PANEL  TSH  T4, FREE  T3, FREE  I-STAT BETA HCG BLOOD, ED ([facility], [facility], [facility] ONLY)  TROPONIN I (HIGH SENSITIVITY)  TROPONIN I (HIGH SENSITIVITY)    EKG EKG Interpretation  Date/Time:  Wednesday July 06 2022 11:10:09 EST Ventricular Rate:  71 PR Interval:  174 QRS Duration: 74 QT Interval:  398 QTC Calculation: 432 R Axis:   66 Text Interpretation: Normal sinus rhythm Possible Left atrial enlargement Confirmed by Gloris Manchester [phone]) on 07/06/2022 5:41:43 PM  Radiology CT Chest W Contrast  Result Date: 07/06/2022 CLINICAL DATA:  Neck pain with radiculopathy going down right upper extremity, chest pain. Possible nodule seen on chest radiograph. EXAM: CT CHEST WITH CONTRAST TECHNIQUE: Multidetector CT imaging of the chest was performed during intravenous contrast administration. RADIATION DOSE REDUCTION: This exam was performed according to the departmental dose-optimization program which includes automated exposure control, adjustment of the mA and/or kV according to patient size and/or use of iterative reconstruction technique. CONTRAST:  39mL OMNIPAQUE IOHEXOL 350 MG/ML SOLN COMPARISON:  Same day chest radiograph and CTA chest 09/05/2007 FINDINGS: Cardiovascular: The heart size is normal. There is no pericardial effusion. The major vasculature of the chest is unremarkable. Mediastinum/Nodes: The thyroid is diffusely enlarged with a peripherally calcified right thyroid nodule measuring up to 3.1 cm which exerts mass effect on the trachea resulting in marked narrowing with trachea measuring 5 mm in transverse diameter (4-19). The nodule has been present since 2009 but the luminal narrowing appears worsened. The esophagus is grossly unremarkable. There is no mediastinal, hilar, or axillary lymphadenopathy. Lungs/Pleura: As above, there is marked narrowing of the trachea at the level of the thyroid. The trachea below this level and remaining  central airways are patent. The lungs well inflated. There is mild dependent subsegmental atelectasis in the lung bases. There is no other focal airspace disease. There is no pulmonary edema. There is no pleural effusion or pneumothorax There are no suspicious nodules. Specifically, there is no left apical nodule to correspond to the finding on the same-day chest radiograph. Upper Abdomen: There is a 1.4 cm left adrenal nodule measuring 6 Hounsfield units consistent with a benign adenoma requiring no specific imaging follow-up. The imaged portions of the upper abdominal viscera are otherwise unremarkable. Musculoskeletal: There is no acute osseous abnormality or suspicious osseous lesion.  IMPRESSION: 1. No suspicious lung nodules. 2. 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 This has been present since 2009, though the luminal narrowing appears worsened. This nodule was previously evaluated by ultrasound and biopsy in 2009. Recommend correlation with those results. 3. Benign left adrenal adenoma requiring no specific imaging follow-up. Electronically Signed   By: Lesia Hausen M.D.   On: 07/06/2022 20:55   CT Cervical Spine Wo Contrast  Result Date: 07/06/2022 CLINICAL DATA:  Cervical radiculopathy, no red flags EXAM: CT CERVICAL SPINE WITHOUT CONTRAST TECHNIQUE: Multidetector CT imaging of the cervical spine was performed without intravenous contrast. Multiplanar CT image reconstructions were also generated. RADIATION DOSE REDUCTION: This exam was performed according to the departmental dose-optimization program which includes automated exposure control, adjustment of the mA and/or kV according to patient size and/or use of iterative reconstruction technique. COMPARISON:  None Available. FINDINGS: Alignment: Normal alignment with straightening of the cervical alignment. Skull base and vertebrae: Negative for fracture or mass Soft  tissues and spinal canal: Moderate thyroid enlargement bilaterally. Mass in the right lobe of the thyroid measures 32 x 28 mm. Central low density and peripheral coarse calcification in the mass. Disc levels:  C2-3: Negative C3-4: Negative C4-5: Small central disc protrusion.  Negative for stenosis C5-6: Disc degeneration with mild uncinate spurring. Mild central canal stenosis. Neural foramina patent bilaterally C6-7: Small central disc protrusion.  Negative for stenosis C7-T1: Negative Upper chest: Lung apices clear bilaterally Other: None IMPRESSION: 1. Mild cervical spondylosis. Small central disc protrusions C4-5 and C6-7. Mild central canal stenosis C5-6. 2. 32 x 28 mm mass in the right lobe of the thyroid. Thyroid ultrasound recommended for further evaluation. (Ref: J Am Coll Radiol. 2015 Feb;12(2): 143-50). Electronically Signed   By: Marlan Palau M.D.   On: 07/06/2022 12:14   DG Chest 2 View  Result Date: 07/06/2022 CLINICAL DATA:  Right chest pain with right shoulder and arm pain EXAM: CHEST - 2 VIEW COMPARISON:  09/10/2020 FINDINGS: Stable leftward deviation and narrowing of the upper tracheal air column attributable to the patient's reported thyroid goiter, this was biopsied in 2009. Borderline enlargement of the cardiopericardial silhouette Projecting over the left lung apex medially, a 1.1 by 0.7 cm nodular density is observed. Not readily visible on prior exams. CT chest recommended for further investigation. No blunting of the costophrenic angles. IMPRESSION: 1. 1.1 x 0.7 cm nodular density projecting over the left lung apex medially. CT chest recommended for further investigation, to exclude malignancy. 2. Borderline enlargement of the cardiopericardial silhouette, without edema. 3. Stable leftward deviation and narrowing of the upper tracheal air column attributable to the patient's reported thyroid goiter. This was biopsied in 2009. Electronically Signed   By: Gaylyn Rong M.D.   On:  07/06/2022 11:50    Procedures Procedures    Medications Ordered in ED Medications  carvedilol (COREG) tablet 6.25 mg (has no administration in time range)  lisinopril (ZESTRIL) tablet 5 mg (0 mg Oral Hold 07/06/22 1847)  predniSONE (DELTASONE) tablet 60 mg (0 mg Oral Hold 07/06/22 1844)  ibuprofen (ADVIL) tablet 600 mg (600 mg Oral Given 07/06/22 1846)  iohexol (OMNIPAQUE) 350 MG/ML injection 50 mL (50 mLs Intravenous Contrast Given 07/06/22 2030)    ED Course/ Medical Decision Making/ A&P                           Medical Decision Making Amount  and/or Complexity of Data Reviewed Labs: ordered. Radiology: ordered.  Risk Prescription drug management.   This patient presents to the ED for concern of right-sided chest and arm pain, this involves an extensive number of treatment options, and is a complaint that carries with it a high risk of complications and morbidity.  The differential diagnosis includes ACS, GERD, neoplasm, pneumonia, cervical radiculopathy   Co morbidities that complicate the patient evaluation  HTN, greater   Additional history obtained:  Additional history obtained from N/A External records from outside source obtained and reviewed including EMR   Lab Tests:  I Ordered, and personally interpreted labs.  The pertinent results include: Normal hemoglobin, no leukocytosis, normal electrolytes, normal troponins x 2, normal thyroid levels   Imaging Studies ordered:  I ordered imaging studies including chest x-ray, CT of chest, CT of cervical spine I independently visualized and interpreted imaging which showed there are areas of stenoses on CT of cervical spine suggesting cervical radiculopathy as etiology of her right arm pain.  Chest x-ray showed concern of nodular density, however, this was not identified on CT imaging.  Patient does have redemonstration of known thyroid goiter which appears to have greater mass effect on trachea when compared to  imaging from 14 years ago. I agree with the radiologist interpretation   Cardiac Monitoring: / EKG:  The patient was maintained on a cardiac monitor.  I personally viewed and interpreted the cardiac monitored which showed an underlying rhythm of: Sinus rhythm   Problem List / ED Course / Critical interventions / Medication management  Patient presents for 1 week of intermittent right-sided chest pain and right arm pain.  Right arm pain starts in area of trapezius and radiates down her arm.  She had a episode of severe arm pain today which prompted her to come to the ED.  Pain is since resolved.  Patient is well-appearing on exam.  Vital signs notable for hypertension.  She has equal strength in her bilateral upper extremities.  She has equal bounding pulses.  Prior to being bedded in the ED, diagnostic workup was initiated.  Lab work shows normal hemoglobin, no leukocytosis, normal electrolytes, normal troponin.  On CT of cervical spine, there are areas of stenoses.  Her description of her right arm pain is consistent with cervical radiculopathy.  Patient was given ibuprofen and prednisone.  There was an incidental finding of a thyroid mass.  Patient states that this is chronic and she was worked up for it several years ago, including biopsy.  She denies any recent enlargement of her thyroid.  On chest x-ray, there was a 1 cm nodular density over the left lung apex.  CT scan was ordered to further evaluate.  CT imaging showed no evidence of concerning pulmonary nodule.  There was redemonstration of known thyroid mass.  Patient underwent biopsy of this mass in 2009.  She has not followed up since then.  There is concern of increasing mass effect from this mass when compared to imaging from 2009.  She was informed of this.  Currently, she is nearly back to the local area and is working on reestablishing care with PCP.  Patient was prescribed steroid taper for empiric treatment of cervical radiculopathy.   She was given contact information for facilitation of establishing PCP as well as following up with neurosurgery.  She was discharged in good condition. I ordered medication including prednisone, ibuprofen for cervical radiculopathy; Coreg and lisinopril for hypertension Reevaluation of the patient after these medicines  showed that the patient improved I have reviewed the patients home medicines and have made adjustments as needed   Social Determinants of Health:  Does not currently have a PCP         Final Clinical Impression(s) / ED Diagnoses Final diagnoses:  Chest pain, unspecified type  Right arm pain    Rx / DC Orders ED Discharge Orders          Ordered    predniSONE (STERAPRED UNI-PAK 21 TAB) 10 MG (21) TBPK tablet  Daily        07/06/22 2211              Gloris Manchesterixon, Noel Henandez, MD 07/06/22 2345

## 2022-07-06 NOTE — Discharge Instructions (Addendum)
Take ibuprofen and Tylenol as needed for pain.  A prescription was sent to your pharmacy for a steroid taper.  This should improve your right arm pain.  There are telephone numbers below to call to establish primary care doctor as well as to see a spinal doctor for your right arm pain.  Call to set up follow-up appointments.  Return to the emergency department for any new or worsening symptoms of concern.

## 2022-07-08 LAB — T3, FREE: T3, Free: 3.6 pg/mL (ref 2.0–4.4)

## 2022-07-15 IMAGING — DX DG CHEST 2V
2 series · 2 of 2 positions shown · non-contrast
Comparison: 09/05/2007

CLINICAL DATA: Chest pain.  Nausea and vomiting

EXAM:
CHEST - 2 VIEW

[chest pa]
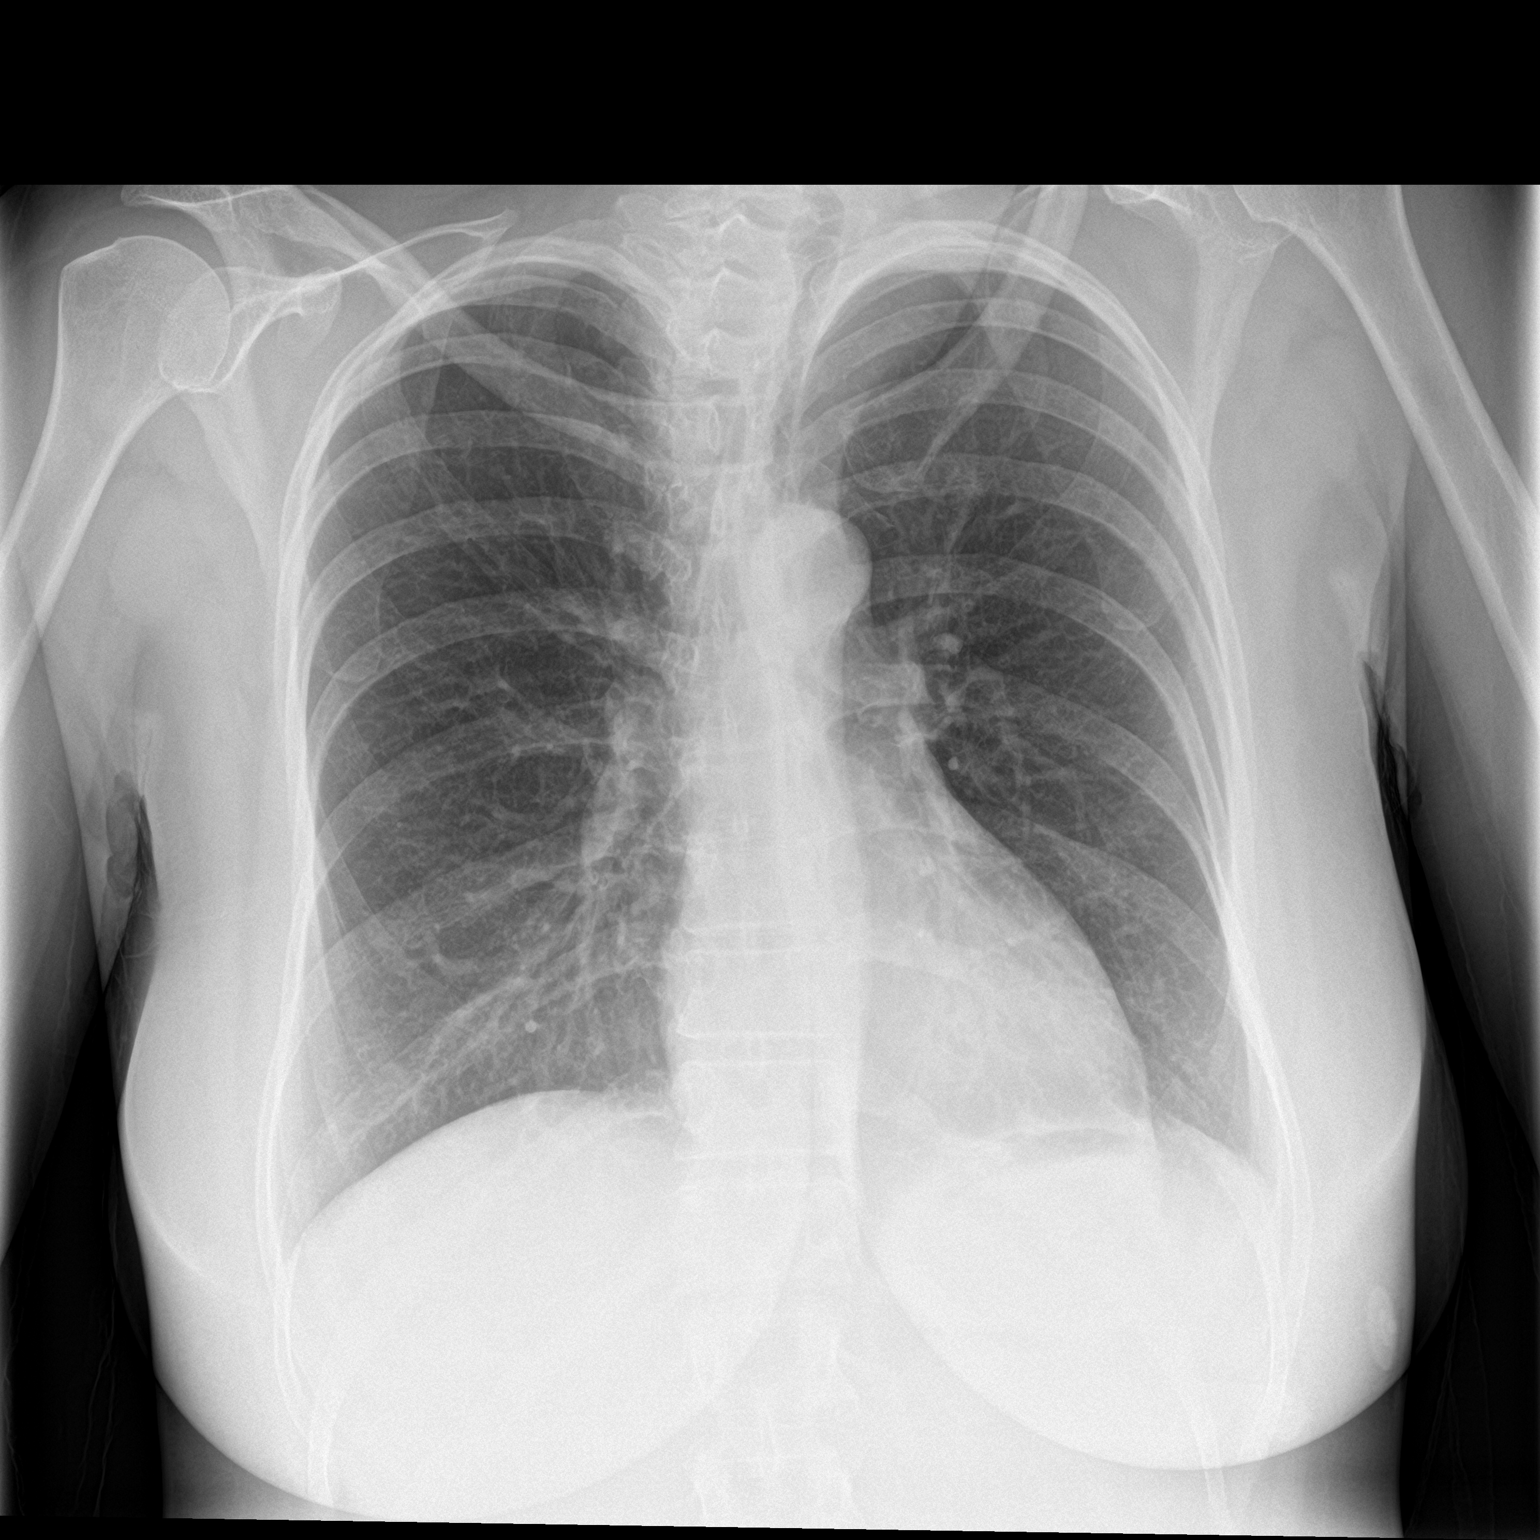

[chest lat]
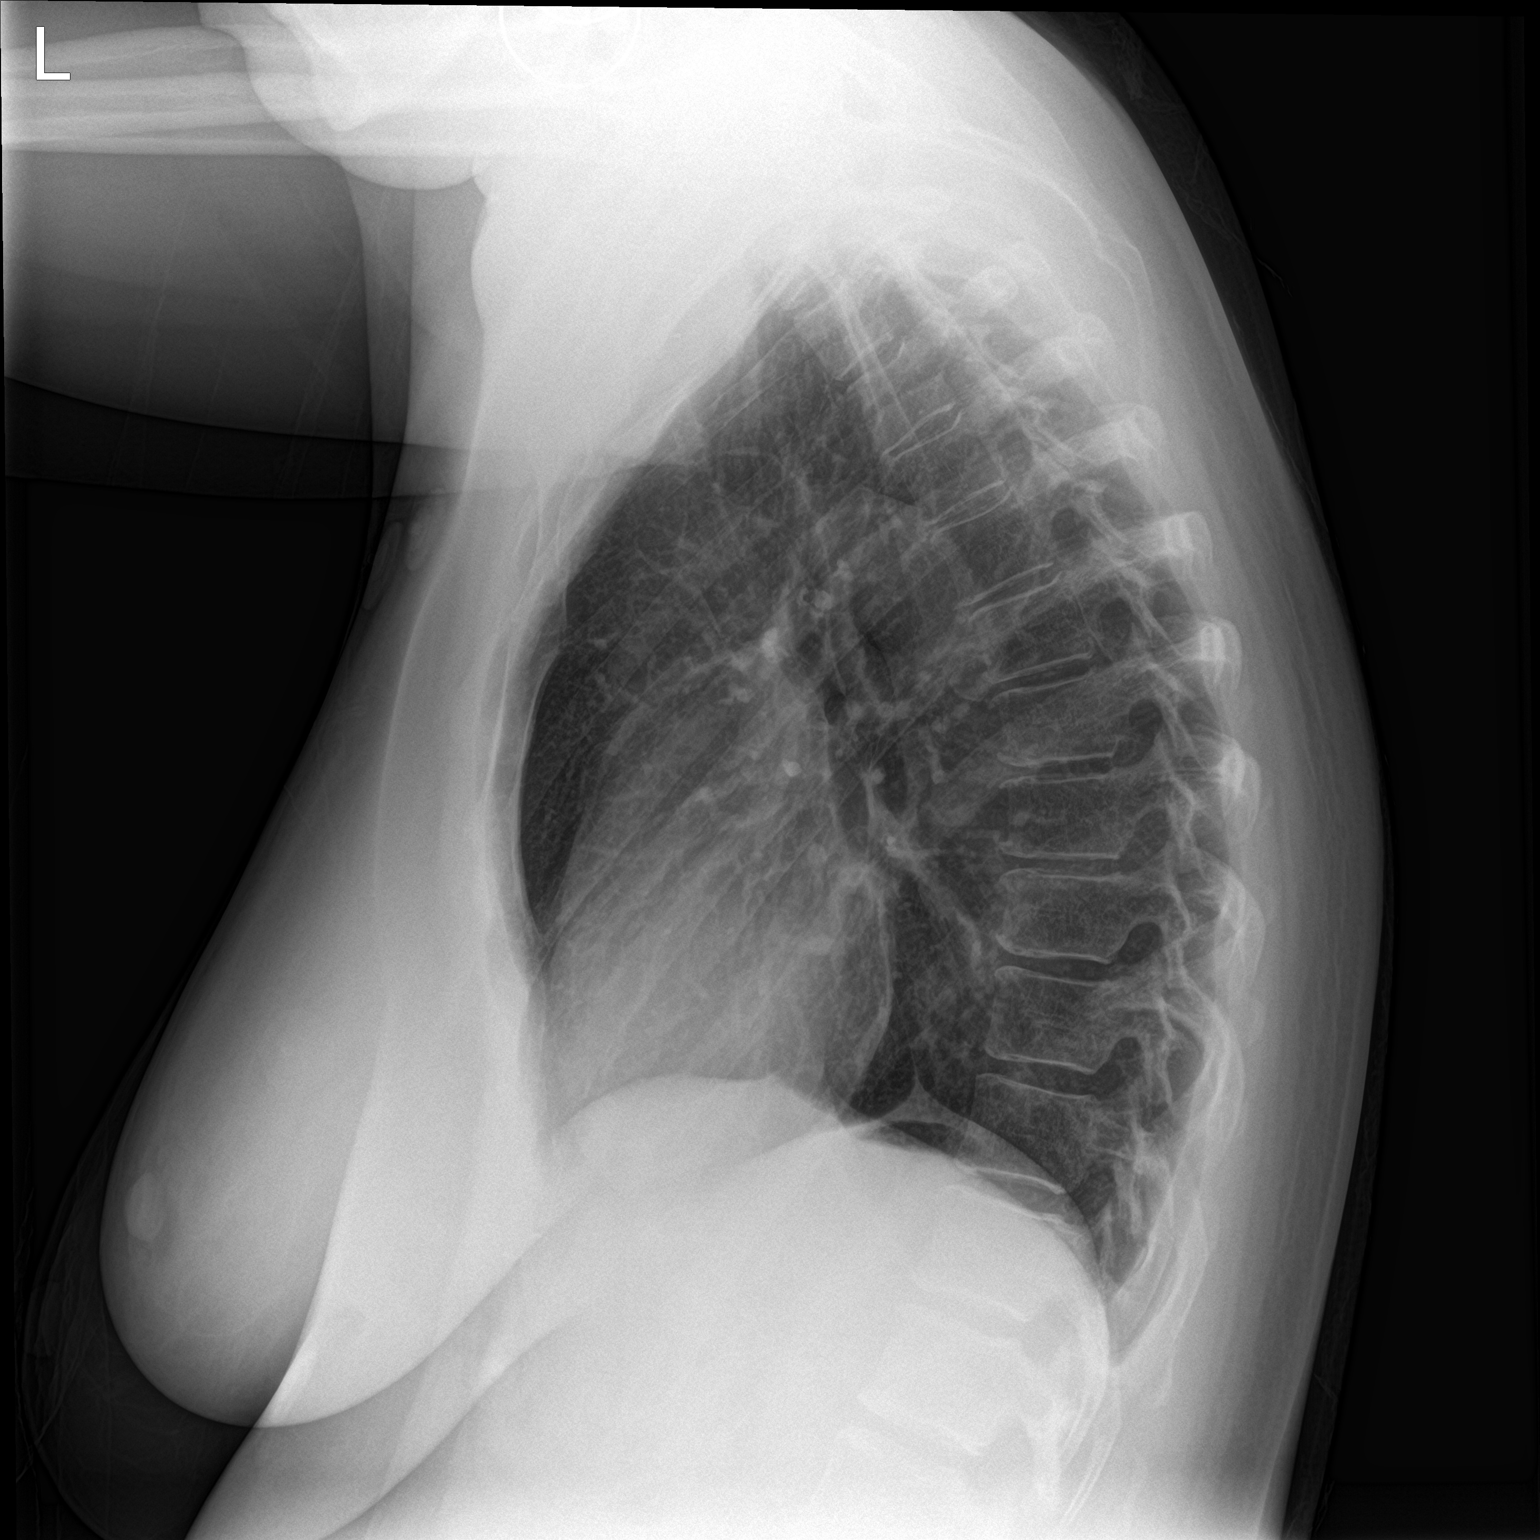

[2 of 2 positions shown; findings below may reference images not displayed]

FINDINGS: Normal heart size and mediastinal contours. Rotated frontal view. No
acute infiltrate or edema. No effusion or pneumothorax. No acute
osseous findings.
IMPRESSION: No active cardiopulmonary disease.

## 2022-07-15 IMAGING — DX DG ABDOMEN ACUTE W/ 1V CHEST
3 series · 3 of 3 positions shown · non-contrast
Comparison: 03/21/2010

CLINICAL DATA: Abdominal pain, vomiting

EXAM:
DG ABDOMEN ACUTE WITH 1 VIEW CHEST

[w chest pa]
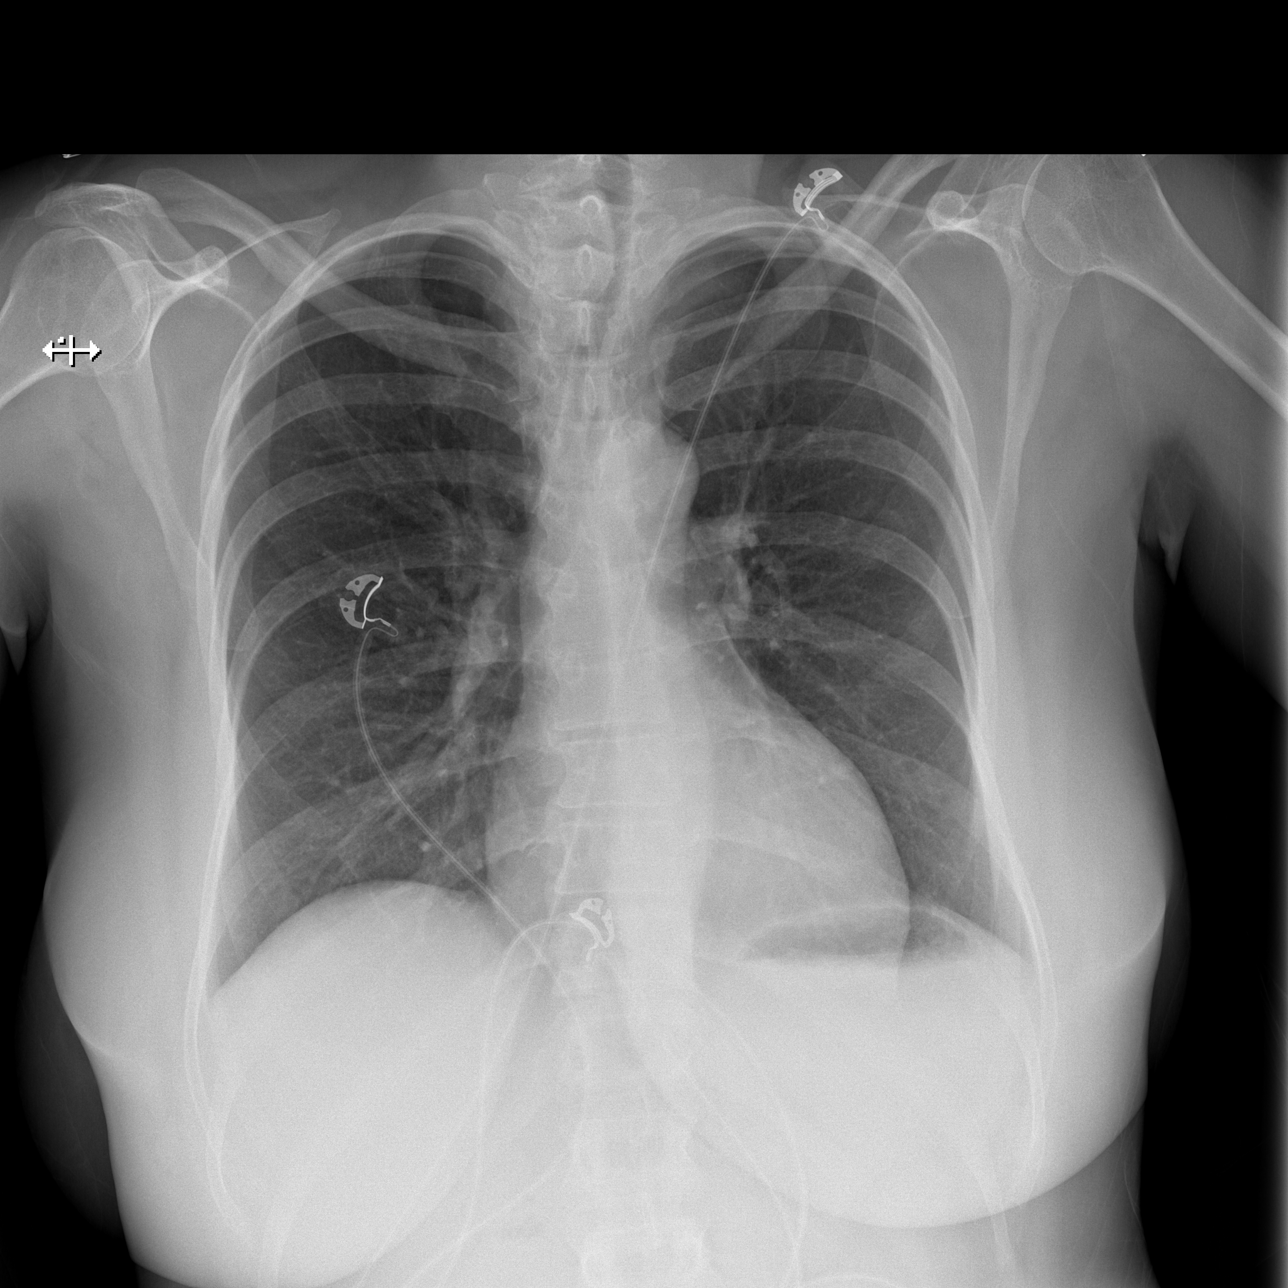

[w abdomen upright]
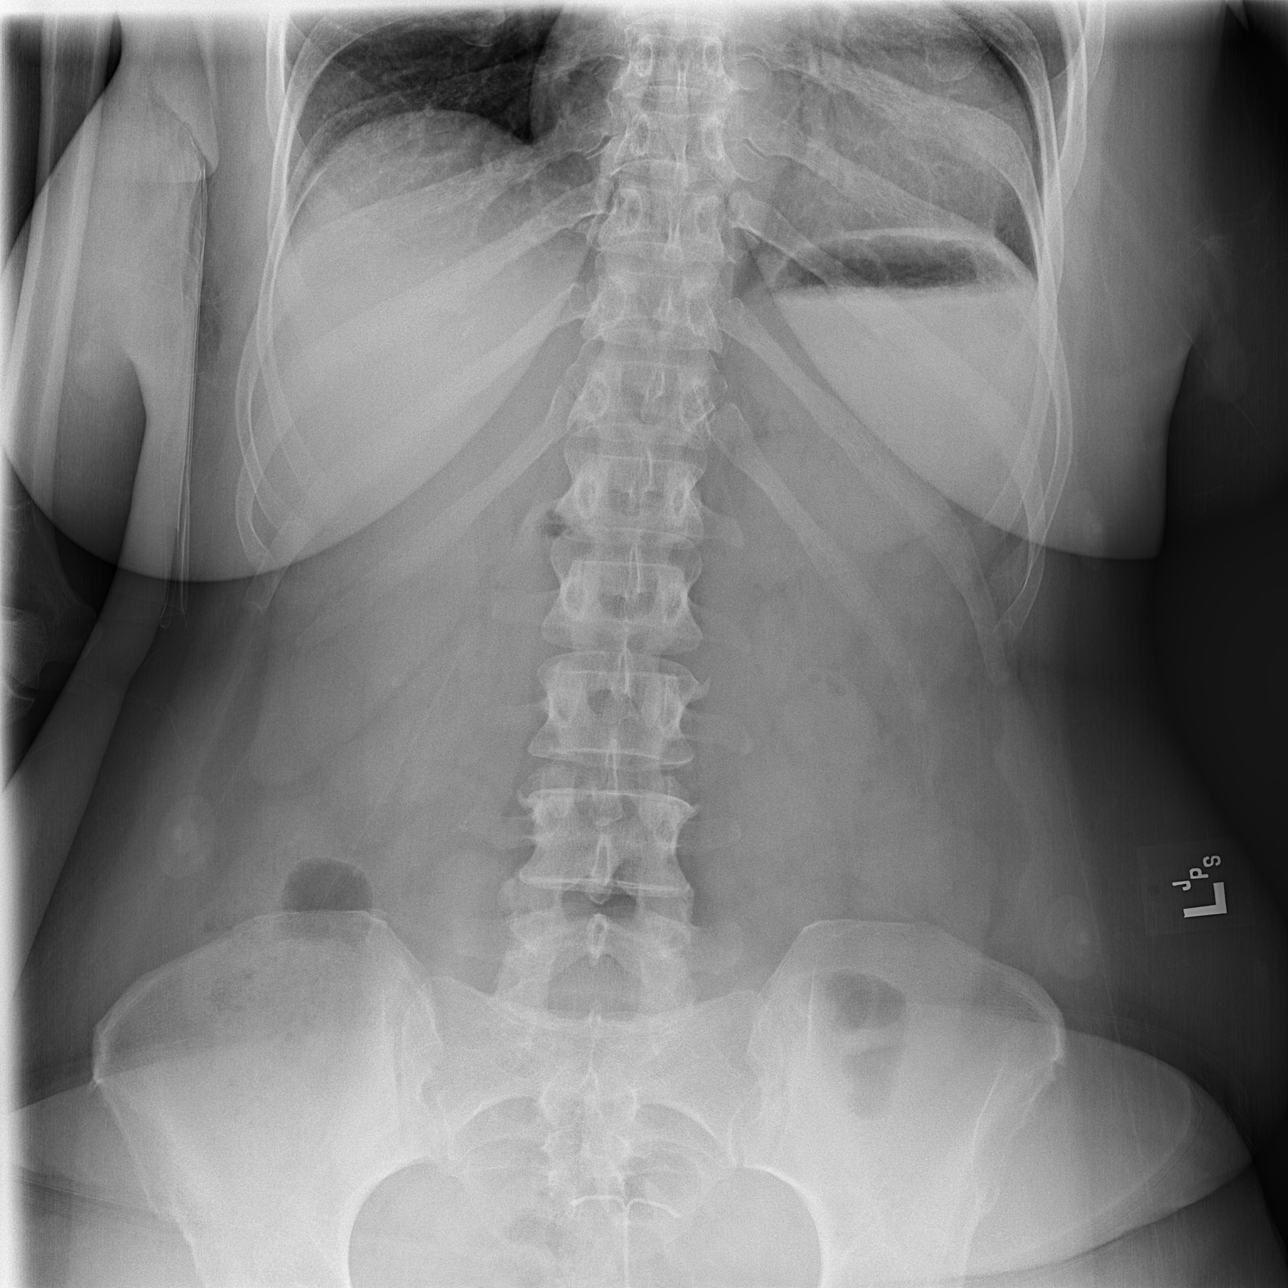

[t abdomen supine]
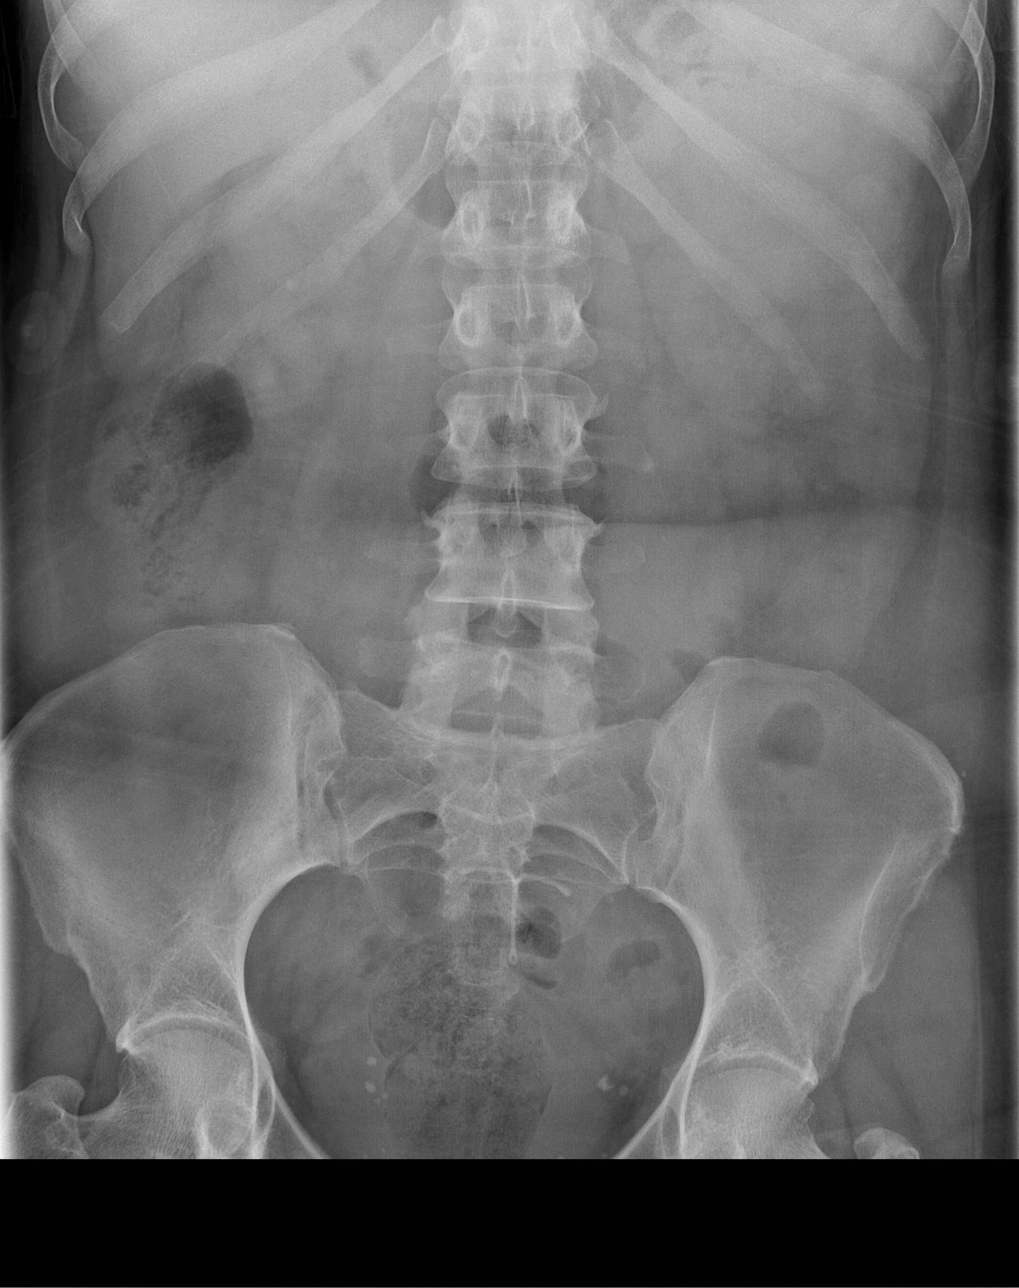

[3 of 3 positions shown; findings below may reference images not displayed]

FINDINGS: There is no evidence of dilated bowel loops or free intraperitoneal
air. No radiopaque calculi or other significant radiographic
abnormality is seen. Heart size and mediastinal contours are within
normal limits. Both lungs are clear.
IMPRESSION: Negative abdominal radiographs.  No acute cardiopulmonary disease.

## 2022-07-22 ENCOUNTER — Ambulatory Visit: Payer: Medicaid Other | Admitting: Family Medicine

## 2022-07-22 ENCOUNTER — Other Ambulatory Visit: Payer: Self-pay

## 2022-07-22 ENCOUNTER — Encounter: Payer: Self-pay | Admitting: Family Medicine

## 2022-07-22 VITALS — BP 132/85 | HR 76 | Ht 66.5 in | Wt 216.2 lb

## 2022-07-22 DIAGNOSIS — Z720 Tobacco use: Secondary | ICD-10-CM

## 2022-07-22 DIAGNOSIS — Z1231 Encounter for screening mammogram for malignant neoplasm of breast: Secondary | ICD-10-CM

## 2022-07-22 DIAGNOSIS — Z131 Encounter for screening for diabetes mellitus: Secondary | ICD-10-CM

## 2022-07-22 DIAGNOSIS — Z1211 Encounter for screening for malignant neoplasm of colon: Secondary | ICD-10-CM

## 2022-07-22 DIAGNOSIS — Z1159 Encounter for screening for other viral diseases: Secondary | ICD-10-CM | POA: Diagnosis not present

## 2022-07-22 DIAGNOSIS — Z13220 Encounter for screening for lipoid disorders: Secondary | ICD-10-CM

## 2022-07-22 NOTE — Progress Notes (Signed)
    SUBJECTIVE:   CHIEF COMPLAINT / HPI:   Patient presents to establish care, she has not seen a PCP in over 5 years. Lives in [place] with her 2 daughters. 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 Denies any medication conditions, when asked about hypertension she says that she had gestational hypertension. She does not take any medications daily other than a multivitamin. The medications that are in her chart she says she took over 14 years ago that were related to her pregnancy. Family history of hypertension and diabetes. Does not exercise regularly with being so busy with work. She is currently working on eating healthier. Drinks alcohol occasionally. Tobacco user for over 10 years, smokes 3-4 cigarettes a day. She is working on cutting down. For anxiety she smokes marijuana twice daily but denies other drug use.   OBJECTIVE:   BP 132/85   Pulse 76   Ht 5' 6.5" (1.689 m)   Wt 216 lb 4 oz (98.1 kg)   LMP  (LMP Unknown)   SpO2 98%   BMI 34.38 kg/m   General: Patient well-appearing, in no acute distress. HEENT: non-tender thyroid CV: RRR, no murmurs or gallops auscultated Resp: CTAB, no wheezing, rales or rhonchi noted Abdomen: soft, nontender, nondistended, presence of bowel sounds Ext: no LE edema noted bilaterally  ASSESSMENT/PLAN:   Tobacco use -congratulated patient on her work on cutting down, encouraged her to continue on this journey -briefly discussed remedies for quitting and plan to continue this discussion at future visits  -continue to encourage tobacco cessation   Health maintenance -Discussed importance and benefits of influenza vaccine, patient politely declines.  -Med rec reviewed and updated appropriately. -Due to PAP smear, instructed patient to follow up at her earliest convenience for this. -Discussed importance of breast cancer screening, mammogram ordered and information provided to patient to schedule appointment to get  this done.  -Discussed the importance of colon cancer screening and patient agreeable to colonoscopy. GI referral placed for colonoscopy.  -Blood work completed earlier this month; BMP, TSH and CBC wnl.  -Pending lipid panel, Hep C screening and A1c given FM history of DM.  -Discussed recommendation of cessation of marijuana use.    Reece Leader, DO [facility]

## 2022-07-22 NOTE — Patient Instructions (Addendum)
It was great seeing you today! Thank you for joining our clinic for your PCP needs!  It was nice learning more about you! We got blood work, I will let you know of any abnormal results. I have provided a handout for getting a mammogram which I have ordered, please call them to schedule a time to get this done for breast cancer screening. I have placed a referral to the GI specialist for a colonoscopy, they should be in touch with you within a few weeks, if you do not hear from them then please give [place] a call so we can try to assist.  Continue to work of cutting down smoking, you are doing a wonderful job!  Please return for a PAP smear.   Please follow up at your next scheduled appointment, if anything arises between now and then, please don't hesitate to contact our office.   Thank you for allowing [place] to be a part of your medical care!  Thank you, Dr. Robyne Peers  Also a reminder of our clinic's no-show policy. Please make sure to arrive at least 15 minutes prior to your scheduled appointment time. Please try to cancel before 24 hours if you are not able to make it. If you no-show for 2 appointments then you will be receiving a warning letter. If you no-show after 3 visits, then you may be at risk of being dismissed from our clinic. This is to ensure that everyone is able to be seen in a timely manner. Thank you, we appreciate your assistance with this!

## 2022-07-22 NOTE — Assessment & Plan Note (Addendum)
-  congratulated patient on her work on cutting down, encouraged her to continue on this journey -briefly discussed remedies for quitting and plan to continue this discussion at future visits  -continue to encourage tobacco cessation

## 2022-07-23 LAB — HEMOGLOBIN A1C
Est. average glucose Bld gHb Est-mCnc: 143 mg/dL
Hgb A1c MFr Bld: 6.6 % — ABNORMAL HIGH (ref 4.8–5.6)

## 2022-07-23 LAB — LIPID PANEL
Chol/HDL Ratio: 2 ratio (ref 0.0–4.4)
Cholesterol, Total: 165 mg/dL (ref 100–199)
HDL: 81 mg/dL (ref >39)
LDL Chol Calc (NIH): 73 mg/dL (ref 0–99)
Triglycerides: 53 mg/dL (ref 0–149)
VLDL Cholesterol Cal: 11 mg/dL (ref 5–40)

## 2022-07-23 LAB — HCV AB W REFLEX TO QUANT PCR: HCV Ab: NONREACTIVE

## 2022-07-23 LAB — HCV INTERPRETATION

## 2022-08-05 ENCOUNTER — Other Ambulatory Visit (HOSPITAL_COMMUNITY)
Admission: RE | Admit: 2022-08-05 | Discharge: 2022-08-05 | Disposition: A | Discharge status: Home / Self Care | Payer: Medicaid Other | Source: Ambulatory Visit | Attending: Family Medicine | Admitting: Family Medicine

## 2022-08-05 ENCOUNTER — Ambulatory Visit (INDEPENDENT_AMBULATORY_CARE_PROVIDER_SITE_OTHER): Payer: Medicaid Other | Admitting: Family Medicine

## 2022-08-05 VITALS — BP 149/86 | HR 75 | Ht 66.0 in | Wt 207.0 lb

## 2022-08-05 DIAGNOSIS — R030 Elevated blood-pressure reading, without diagnosis of hypertension: Secondary | ICD-10-CM | POA: Diagnosis not present

## 2022-08-05 DIAGNOSIS — Z124 Encounter for screening for malignant neoplasm of cervix: Secondary | ICD-10-CM | POA: Insufficient documentation

## 2022-08-05 NOTE — Assessment & Plan Note (Signed)
-  BP elevated, no prior diagnosis of chronic hypertension although had gestational hypertension during pregnancy -reassuringly patient is asymptomatic  -ED precautions -follow up in 1 month for BP check

## 2022-08-05 NOTE — Assessment & Plan Note (Signed)
-  PAP performed today for cervical cancer screening with HPV cotesting pending -patient politely declined STI screening and other testing -scheduled follow up in colposcopy clinic for IUD removal and reinsertion

## 2022-08-05 NOTE — Patient Instructions (Signed)
It was great seeing you today!  Today we performed a PAP smear to screen for cervical cancer. It is time to get your IUD out so we will schedule you to return for this to be removed and replaced with a new IUD.   Please follow up at your next scheduled appointment, if anything arises between now and then, please don't hesitate to contact our office.   Thank you for allowing [place] to be a part of your medical care!  Thank you, Dr. Larae Grooms  Also a reminder of our clinic's no-show policy. Please make sure to arrive at least 15 minutes prior to your scheduled appointment time. Please try to cancel before 24 hours if you are not able to make it. If you no-show for 2 appointments then you will be receiving a warning letter. If you no-show after 3 visits, then you may be at risk of being dismissed from our clinic. This is to ensure that everyone is able to be seen in a timely manner. Thank you, we appreciate your assistance with this!

## 2022-08-05 NOTE — Progress Notes (Signed)
    SUBJECTIVE:   CHIEF COMPLAINT / HPI:   Patient presents for a PAP smear today, denies any other concerns or symptoms at this time.   OBJECTIVE:   BP (!) 149/86   Pulse 75   Ht 5\' 6"  (1.676 m)   Wt 207 lb (93.9 kg)   LMP 07/26/2022   SpO2 100%   BMI 33.41 kg/m   General: Patient well-appearing, in no acute distress. Resp: normal work of breathing noted  GU: normal labia, no rashes or lesions noted, no vaginal or cervical discharge noted, normal vagina and cervix, no adnexal masses or tenderness noted, no associated odor noted  GU exam performed in the presence of chaperone, Tashira, Leggette, CMA.   ASSESSMENT/PLAN:   Encounter for Papanicolaou smear of cervix -PAP performed today for cervical cancer screening with HPV cotesting pending -patient politely declined STI screening and other testing -scheduled follow up in colposcopy clinic for IUD removal and reinsertion   Elevated BP without diagnosis of hypertension -BP elevated, no prior diagnosis of chronic hypertension although had gestational hypertension during pregnancy -reassuringly patient is asymptomatic  -ED precautions -follow up in 1 month for BP check    Korion Cuevas Larae Grooms, [place]

## 2022-08-09 LAB — CYTOLOGY - PAP
Comment: NEGATIVE
Diagnosis: NEGATIVE
High risk HPV: NEGATIVE

## 2022-08-25 ENCOUNTER — Ambulatory Visit: Payer: Medicaid Other

## 2022-09-01 ENCOUNTER — Ambulatory Visit: Payer: Medicaid Other | Admitting: Student

## 2022-09-01 VITALS — BP 112/62 | HR 62 | Wt 200.0 lb

## 2022-09-01 DIAGNOSIS — Z30430 Encounter for insertion of intrauterine contraceptive device: Secondary | ICD-10-CM

## 2022-09-01 LAB — POCT URINE PREGNANCY: Preg Test, Ur: NEGATIVE

## 2022-09-01 NOTE — Patient Instructions (Signed)

## 2022-09-01 NOTE — Progress Notes (Signed)
IUD Intrauterine Device Removal Procedure Note PRE-OP DIAGNOSIS: Contraceptive Management  POST-OP DIAGNOSIS: Same  PROCEDURE: IUD removal Performing Physician: Dr. Adah Salvage  Supervising Physician (if applicable): Dr. Gwendlyn Deutscher  PROCEDURE:  The speculum was placed and the IUD string visualized.  Using ringed  forceps, the IUD string was grasped and the device was removed without  difficulty.  Bleeding was minimal.  Followup: The patient tolerated the procedure well without  complications.  Standard post-procedure care is explained and return  precautions are given.  IUD Insertion Procedure Note  Pre-operative Diagnosis: Contraceptive Management   Post-operative Diagnosis: same  Indications: contraception  Procedure Details  Urine pregnancy test was done and result was Negative.  The risks (including infection, bleeding, pain, and uterine perforation) and benefits of the procedure were explained to the patient and Written informed consent was obtained.    Cervix cleansed with Betadine. Uterus sounded to 8 cm. IUD inserted without difficulty. String visible and trimmed. Patient tolerated procedure well.  IUD Information: Mirena.  Condition: Stable  Complications: None  Plan:  The patient was advised to call for any fever or for prolonged or severe pain or bleeding. She was advised to use NSAID as needed for mild to moderate pain.   Attending Physician Documentation: I was present for or participated in the entire procedure, including opening and closing.

## 2022-09-02 ENCOUNTER — Ambulatory Visit (INDEPENDENT_AMBULATORY_CARE_PROVIDER_SITE_OTHER): Payer: Medicaid Other

## 2022-09-02 DIAGNOSIS — Z111 Encounter for screening for respiratory tuberculosis: Secondary | ICD-10-CM | POA: Diagnosis present

## 2022-09-02 NOTE — Progress Notes (Signed)
Patient presents to nurse clinic for PPD. PPD placed in left forearm. Patient to return on 2/12 for reading.

## 2022-09-05 ENCOUNTER — Ambulatory Visit: Payer: Medicaid Other | Admitting: Family Medicine

## 2022-09-05 ENCOUNTER — Ambulatory Visit: Payer: Medicaid Other

## 2022-09-05 DIAGNOSIS — Z111 Encounter for screening for respiratory tuberculosis: Secondary | ICD-10-CM

## 2022-09-05 LAB — TB SKIN TEST
Induration: 0 mm
TB Skin Test: NEGATIVE

## 2022-09-05 NOTE — Progress Notes (Signed)
PPD Reading Note PPD read and results entered in EpicCare. Result: 0 mm induration. Interpretation: Negative Allergic reaction: No  

## 2022-09-06 NOTE — Progress Notes (Signed)
Reviewed and agree.

## 2022-09-07 MED ORDER — LEVONORGESTREL 20 MCG/DAY IU IUD
1.0000 | INTRAUTERINE_SYSTEM | Freq: Once | INTRAUTERINE | Status: AC
  Administered 2022-09-01: 1 via INTRAUTERINE

## 2022-09-07 NOTE — Addendum Note (Signed)
Addended by: Talbot Grumbling on: 09/07/2022 12:32 PM   Modules accepted: Orders

## 2022-09-16 ENCOUNTER — Ambulatory Visit
Admission: RE | Admit: 2022-09-16 | Discharge: 2022-09-16 | Disposition: A | Discharge status: Home / Self Care | Payer: Medicaid Other | Source: Ambulatory Visit | Attending: Family Medicine | Admitting: Family Medicine

## 2022-09-16 DIAGNOSIS — Z1231 Encounter for screening mammogram for malignant neoplasm of breast: Secondary | ICD-10-CM

## 2022-09-22 ENCOUNTER — Other Ambulatory Visit: Payer: Self-pay | Admitting: Family Medicine

## 2022-09-22 DIAGNOSIS — R928 Other abnormal and inconclusive findings on diagnostic imaging of breast: Secondary | ICD-10-CM

## 2022-10-06 ENCOUNTER — Ambulatory Visit
Admission: RE | Admit: 2022-10-06 | Discharge: 2022-10-06 | Disposition: A | Discharge status: Home / Self Care | Payer: Medicaid Other | Source: Ambulatory Visit | Attending: Family Medicine | Admitting: Family Medicine

## 2022-10-06 DIAGNOSIS — R928 Other abnormal and inconclusive findings on diagnostic imaging of breast: Secondary | ICD-10-CM

## 2022-11-24 ENCOUNTER — Telehealth: Payer: Self-pay | Admitting: *Deleted

## 2022-11-24 NOTE — Telephone Encounter (Signed)
Pt would like a referrral to Dr. Horald Pollen ( [address] [place], [place] [address]) for her thyroid.  States that she has spokent o Dr. Robyne Peers about it before and was told to call back when she was ready to see a specialist. Jone Baseman, CMA

## 2022-11-25 ENCOUNTER — Other Ambulatory Visit: Payer: Self-pay | Admitting: Family Medicine

## 2022-11-25 DIAGNOSIS — E049 Nontoxic goiter, unspecified: Secondary | ICD-10-CM

## 2022-11-28 NOTE — Telephone Encounter (Signed)
Patient informed and agreeable. Is aware that if previous labs were normal, Endocrinology may not take her. Jone Baseman, CMA

## 2022-12-05 ENCOUNTER — Telehealth: Payer: Self-pay | Admitting: *Deleted

## 2022-12-05 NOTE — Telephone Encounter (Signed)
LM for patient to call back about her referral.  I received a call from [facility] about them not being able to accept her insurance.  Asked patient to call back to see how she would like to proceed and if there is another office of her choosing.  Topher Buenaventura,CMA

## 2022-12-05 NOTE — Telephone Encounter (Signed)
Patient LVM on nurse line returning call to Jazmin. Attempted to call patient. She did not answer, left message asking for patient to return call to office.   Veronda Prude, RN

## 2022-12-08 NOTE — Telephone Encounter (Signed)
Patient returns call to nurse line. She is requesting that referral be sent to [facility]- .   Will forward to Jazmin for further assistance.   Veronda Prude, RN

## 2022-12-09 NOTE — Telephone Encounter (Signed)
Referral faxed to [facility].  They will review and call patient about an appt.  Rita Burke [facility] - [facility] Address: [address], [place], [place] [address] Phone: [phone]

## 2023-06-18 ENCOUNTER — Emergency Department (HOSPITAL_BASED_OUTPATIENT_CLINIC_OR_DEPARTMENT_OTHER): Payer: Medicaid Other | Admitting: Radiology

## 2023-06-18 ENCOUNTER — Emergency Department (HOSPITAL_BASED_OUTPATIENT_CLINIC_OR_DEPARTMENT_OTHER)
Admission: EM | Admit: 2023-06-18 | Discharge: 2023-06-18 | Disposition: A | Discharge status: Home / Self Care | Payer: Medicaid Other | Attending: Emergency Medicine | Admitting: Emergency Medicine

## 2023-06-18 ENCOUNTER — Encounter (HOSPITAL_BASED_OUTPATIENT_CLINIC_OR_DEPARTMENT_OTHER): Payer: Self-pay | Admitting: Emergency Medicine

## 2023-06-18 ENCOUNTER — Other Ambulatory Visit: Payer: Self-pay

## 2023-06-18 DIAGNOSIS — M25562 Pain in left knee: Secondary | ICD-10-CM | POA: Insufficient documentation

## 2023-06-18 MED ORDER — METHYLPREDNISOLONE 4 MG PO TBPK: ORAL_TABLET | ORAL | 0 refills | Status: DC

## 2023-06-18 NOTE — Discharge Instructions (Signed)
Follow-up with orthopedics.  Use Ace wrap and crutches to bear weight as tolerated.  Recommend 1000 mg of Tylenol every 6 hours as needed for pain.  Take Medrol Dosepak as prescribed.

## 2023-06-18 NOTE — ED Triage Notes (Signed)
Left knee pain and swelling x 2 days , no injury or fall .

## 2023-06-18 NOTE — ED Provider Notes (Signed)
[place] EMERGENCY DEPARTMENT AT [facility] Provider Note   CSN: [id number] Arrival date & time: 06/18/23  1333     History  Chief Complaint  Patient presents with   Knee Pain    Left     Rita Burke is a 52 y.o. female.  Patient here with left knee pain for the last few days.  No trauma.  History of some problems with the knee in the past.  Nothing makes it worse or better.  Denies any fever chills weakness numbness tingling.  No significant medical history otherwise.  The history is provided by the patient.       Home Medications Prior to Admission medications   Medication Sig Start Date End Date Taking? Authorizing Provider  methylPREDNISolone (MEDROL DOSEPAK) 4 MG TBPK tablet Follow package insert 06/18/23  Yes Roshawnda Pecora, DO  carvedilol (COREG) 6.25 MG tablet Take 6.25 mg by mouth 2 (two) times daily with meals.   Patient not taking: Reported on 07/22/2022    [provider]  lisinopril (PRINIVIL,ZESTRIL) 5 MG tablet Take 5 mg by mouth daily.   Patient not taking: Reported on 07/22/2022    [provider]  omeprazole (PRILOSEC) 20 MG capsule Take 1 capsule (20 mg total) by mouth 2 (two) times daily before a meal. Patient not taking: Reported on 07/22/2022 09/10/20   Linwood Dibbles, MD  ondansetron (ZOFRAN ODT) 8 MG disintegrating tablet Take 0.5 tablets (4 mg total) by mouth every 8 (eight) hours as needed for up to 12 doses for nausea or vomiting. Patient not taking: Reported on 07/22/2022 03/06/22   Duard Brady, MD  predniSONE (STERAPRED UNI-PAK 21 TAB) 10 MG (21) TBPK tablet Take by mouth daily. Take 6 tabs by mouth daily  for 2 days, then 5 tabs for 2 days, then 4 tabs for 2 days, then 3 tabs for 2 days, 2 tabs for 2 days, then 1 tab by mouth daily for 2 days Patient not taking: Reported on 07/22/2022 07/06/22   Gloris Manchester, MD  spironolactone (ALDACTONE) 25 MG tablet Take 25 mg by mouth daily.   Patient not taking: Reported on  07/22/2022    [provider]      Allergies    Penicillins    Review of Systems   Review of Systems  Physical Exam Updated Vital Signs BP (!) 145/81 (BP Location: Left Arm)   Pulse 70   Temp 98 F (36.7 C)   Resp 16   Wt 88 kg   LMP 07/26/2022   SpO2 99%   BMI 31.31 kg/m  Physical Exam Vitals and nursing note reviewed.  Constitutional:      General: She is not in acute distress.    Appearance: She is well-developed.  HENT:     Head: Normocephalic and atraumatic.  Eyes:     Conjunctiva/sclera: Conjunctivae normal.  Cardiovascular:     Rate and Rhythm: Normal rate and regular rhythm.     Pulses: Normal pulses.     Heart sounds: No murmur heard. Pulmonary:     Effort: Pulmonary effort is normal. No respiratory distress.     Breath sounds: Normal breath sounds.  Abdominal:     Palpations: Abdomen is soft.     Tenderness: There is no abdominal tenderness.  Musculoskeletal:        General: Tenderness present. No swelling. Normal range of motion.     Cervical back: Neck supple.     Comments: Tenderness to the anterior portion of  the patella, range of motion is intact, no obvious swelling  Skin:    General: Skin is warm and dry.     Capillary Refill: Capillary refill takes less than 2 seconds.  Neurological:     Mental Status: She is alert.     Sensory: No sensory deficit.     Motor: No weakness.  Psychiatric:        Mood and Affect: Mood normal.     ED Results / Procedures / Treatments   Labs (all labs ordered are listed, but only abnormal results are displayed) Labs Reviewed - No data to display  EKG None  Radiology DG Knee Complete 4 Views Left  Result Date: 06/18/2023 CLINICAL DATA:  pain EXAM: LEFT KNEE - COMPLETE 4+ VIEW COMPARISON:  None Available. FINDINGS: No acute fracture or dislocation. Joint spaces and alignment are relatively maintained with minimal joint space narrowing and osteophyte formation of the medial compartment. No area of  erosion or osseous destruction. No unexpected radiopaque foreign body. Soft tissues are unremarkable. IMPRESSION: No acute fracture or dislocation. Electronically Signed   By: Meda Klinefelter M.D.   On: 06/18/2023 15:01    Procedures Procedures    Medications Ordered in ED Medications - No data to display  ED Course/ Medical Decision Making/ A&P                                 Medical Decision Making Amount and/or Complexity of Data Reviewed Radiology: ordered.  Risk Prescription drug management.   Rita Burke is here with left knee pain.  Normal vitals.  No fever.  X-ray done prior to my evaluation that is normal with no fracture or dislocation and no major effusion.  There is some arthritic changes.  Overall I suspect inflammatory process.  Have no concern for septic joint or major ligamentous injury.  Will put her on Medrol Dosepak, Ace wrap and crutches and have her follow-up with orthopedics.  She is neurovascular neuromuscular intact on exam.  Discharged in good condition.  Understands return precautions.  This chart was dictated using voice recognition software.  Despite best efforts to proofread,  errors can occur which can change the documentation meaning.         Final Clinical Impression(s) / ED Diagnoses Final diagnoses:  Acute pain of left knee    Rx / DC Orders ED Discharge Orders          Ordered    methylPREDNISolone (MEDROL DOSEPAK) 4 MG TBPK tablet        06/18/23 1542              Dmoni Fortson, DO 06/18/23 1544

## 2023-08-03 ENCOUNTER — Ambulatory Visit (INDEPENDENT_AMBULATORY_CARE_PROVIDER_SITE_OTHER): Payer: Self-pay | Admitting: Family Medicine

## 2023-08-03 ENCOUNTER — Encounter: Payer: Self-pay | Admitting: Family Medicine

## 2023-08-03 VITALS — BP 151/80 | HR 86 | Ht 66.0 in | Wt 195.0 lb

## 2023-08-03 DIAGNOSIS — R030 Elevated blood-pressure reading, without diagnosis of hypertension: Secondary | ICD-10-CM

## 2023-08-03 DIAGNOSIS — J069 Acute upper respiratory infection, unspecified: Secondary | ICD-10-CM

## 2023-08-03 MED ORDER — BENZONATATE 100 MG PO CAPS: 100.0000 mg | ORAL_CAPSULE | Freq: Two times a day (BID) | ORAL | 0 refills | Status: DC | PRN

## 2023-08-03 NOTE — Progress Notes (Signed)
    SUBJECTIVE:   CHIEF COMPLAINT / HPI:   Sick symptoms Has been having chest congestion, cough, mild sore throat for last couple days.  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  Mild chest pain when she coughs.  No chest pain at rest.  No shortness of breath.  Mild nausea without vomiting.  No bowel changes.  Eating and drinking okay.  Has not tried anything to help with symptoms yet.  No increased leg edema over this period.  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  OBJECTIVE:   BP (!) 151/80   Pulse 86   Ht 5' 6 (1.676 m)   Wt 195 lb (88.5 kg)   LMP 07/26/2022   SpO2 100%   BMI 31.47 kg/m   General: Alert and oriented, in NAD Skin: Warm, dry, and intact without lesions HEENT: NCAT, EOM grossly normal, midline nasal septum, mildly erythematous posterior oropharynx Cardiac: RRR, no m/r/g appreciated Respiratory: CTAB, breathing and speaking comfortably on RA Abdominal: Soft, nontender, nondistended, normoactive bowel sounds Extremities: Moves all extremities grossly equally Neurological: No gross focal deficit Psychiatric: Appropriate mood and affect   ASSESSMENT/PLAN:   Viral URI History and exam most concerning for viral process.  Reassuringly no focal findings on exam to suggest pneumonia, pulmonary edema, volume overload.  Recommended conservative management with continued OTC meds including dextromethorphan and Tylenol/ibuprofen  as well as honey and warm tea.  Also sent in Tessalon  Perles to be used as needed for acute cough. Discussed returning to care should she have fever, persistent chest pain, increasing shortness of breath, or otherwise is not improving.   Elevated BP without diagnosis of hypertension BP elevated today.  Reassuring exam.  Will message front office team to schedule patient for follow-up with PCP.   Health maintenance Recommend addressing age-appropriate screenings  at next office visit with PCP.  Stuart Redo, MD [facility]

## 2023-08-03 NOTE — Patient Instructions (Signed)
 You have a viral infection. I have sent in tessalon  perles. You can also take dextromethorphan over the counter. You can take tylenol with ibuprofen  as needed. Also can use honey in warm tea to soothe the throat. Be sure to return to care if symptoms are worsening or not improving, though the cough can last up to a couple weeks.

## 2023-08-03 NOTE — Assessment & Plan Note (Signed)
 BP elevated today.  Reassuring exam.  Will message front office team to schedule patient for follow-up with PCP.

## 2023-08-30 ENCOUNTER — Encounter: Payer: Self-pay | Admitting: Student

## 2023-08-30 ENCOUNTER — Ambulatory Visit (INDEPENDENT_AMBULATORY_CARE_PROVIDER_SITE_OTHER): Payer: Self-pay | Admitting: Student

## 2023-08-30 VITALS — BP 140/80 | HR 74 | Ht 66.0 in | Wt 193.0 lb

## 2023-08-30 DIAGNOSIS — R030 Elevated blood-pressure reading, without diagnosis of hypertension: Secondary | ICD-10-CM

## 2023-08-30 DIAGNOSIS — T148XXA Other injury of unspecified body region, initial encounter: Secondary | ICD-10-CM

## 2023-08-30 DIAGNOSIS — R21 Rash and other nonspecific skin eruption: Secondary | ICD-10-CM

## 2023-08-30 MED ORDER — TRIAMCINOLONE ACETONIDE 0.5 % EX OINT: 1.0000 | TOPICAL_OINTMENT | Freq: Two times a day (BID) | CUTANEOUS | 0 refills | Status: DC

## 2023-08-30 NOTE — Assessment & Plan Note (Addendum)
 Elevated 140/80 on recheck. She states she is not taking any meds -1 month follow up d/w with patient

## 2023-08-30 NOTE — Progress Notes (Addendum)
    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 Presents with a new onset of tingling and bruising in the right arm, along with a small knot underneath the skin. She denies any known injury or trauma to the area. She also reports itchiness and burning in the hand, which started about four days ago. She has been applying shea butter to the area. She is not currently taking any medications. No bleeding anywhere.  No fevers.  No rash anywhere else.  Not on any blood thinners  PERTINENT  PMH / PSH: tobacco use  OBJECTIVE:   BP (!) 140/80   Pulse 74   Ht 5' 6 (1.676 m)   Wt 193 lb (87.5 kg)   LMP 07/26/2022   SpO2 100%   BMI 31.15 kg/m   General: Well appearing, NAD, awake, alert, responsive to questions Head: Normocephalic atraumatic, oral lesions Respiratory: chest rises symmetrically,  no increased work of breathing Extremities: Right arm with brusing and central clearin with small nodule in between.  Hand with bruising and pinpoint scabs.  No palmar lesions   ASSESSMENT/PLAN:   Assessment & Plan Bruising Discussed that this is most likely inadvertent injury and should get better with time. -CBC to check for platelet count -Triamcinolone  0.5 twice daily -Return if not improving Elevated BP without diagnosis of hypertension Elevated 140/80 on recheck. She states she is not taking any meds -1 month follow up d/w with patient  Wendel Lesch, MD [facility]

## 2023-08-30 NOTE — Patient Instructions (Signed)
 It was great to see you! Thank you for allowing me to participate in your care!   Our plans for today:  -This looks like trauma from something even though were not sure what happened, I would give this time to heal and we will also check your platelets to make sure these are not very low -I am also sending in a cream called triamcinolone  to put on your hand twice a day to help with itching and avoid scratching this area.  Take care and seek immediate care sooner if you develop any concerns.  Wendel Lesch, MD

## 2023-08-31 LAB — CBC
Hematocrit: 40.9 % (ref 34.0–46.6)
Hemoglobin: 13.3 g/dL (ref 11.1–15.9)
MCH: 28.9 pg (ref 26.6–33.0)
MCHC: 32.5 g/dL (ref 31.5–35.7)
MCV: 89 fL (ref 79–97)
Platelets: 138 10*3/uL — ABNORMAL LOW (ref 150–450)
RBC: 4.6 x10E6/uL (ref 3.77–5.28)
RDW: 13.6 % (ref 11.7–15.4)
WBC: 5.2 10*3/uL (ref 3.4–10.8)

## 2023-09-01 ENCOUNTER — Telehealth: Payer: Self-pay

## 2023-09-01 NOTE — Telephone Encounter (Signed)
 Patient calls nurse line in regards to recent visit.   She reports she thought PCP was going to place a referral to Ortho for her trauma injury.   She reports she did not see this in her AVS.   Advised will forward to PCP.

## 2023-09-04 NOTE — Telephone Encounter (Signed)
 Attempted to reach patient to inform of note left by provider. No answer. LVM of note also left number to [facility]. Linnie Riches, CMA

## 2023-09-18 ENCOUNTER — Ambulatory Visit: Payer: Self-pay | Admitting: Student

## 2023-09-18 NOTE — Progress Notes (Deleted)
    SUBJECTIVE:   CHIEF COMPLAINT / HPI: HTN  Has had elevated blood pressures on previous visits At last visit was 140/80 on recheck  PERTINENT  PMH / PSH: ***  OBJECTIVE:   LMP 07/26/2022   ***  ASSESSMENT/PLAN:   No problem-specific Assessment & Plan notes found for this encounter.     Levin Erp, MD [facility]

## 2023-09-19 ENCOUNTER — Encounter: Payer: Self-pay | Admitting: Student

## 2023-09-19 ENCOUNTER — Ambulatory Visit (INDEPENDENT_AMBULATORY_CARE_PROVIDER_SITE_OTHER): Payer: Self-pay | Admitting: Student

## 2023-09-19 VITALS — BP 124/75 | HR 69 | Ht 66.5 in | Wt 195.2 lb

## 2023-09-19 DIAGNOSIS — R030 Elevated blood-pressure reading, without diagnosis of hypertension: Secondary | ICD-10-CM

## 2023-09-19 DIAGNOSIS — Z1211 Encounter for screening for malignant neoplasm of colon: Secondary | ICD-10-CM

## 2023-09-19 DIAGNOSIS — E119 Type 2 diabetes mellitus without complications: Secondary | ICD-10-CM | POA: Insufficient documentation

## 2023-09-19 DIAGNOSIS — Z13220 Encounter for screening for lipoid disorders: Secondary | ICD-10-CM

## 2023-09-19 DIAGNOSIS — M79644 Pain in right finger(s): Secondary | ICD-10-CM

## 2023-09-19 LAB — POCT GLYCOSYLATED HEMOGLOBIN (HGB A1C): HbA1c, POC (controlled diabetic range): 6.7 % (ref 0.0–7.0)

## 2023-09-19 NOTE — Patient Instructions (Addendum)
 It was great to see you today! Thank you for choosing [facility] for your primary care.  Today we addressed: You potentially have diabetes, lets check an A1c. Lets recheck a lipid panel for elevated cholesterol. I have submitted a referral to GI for colonoscopy. Regarding the right third finger pain, I suspect there may be an osteoarthritic component.  Try ibuprofen 600 mg every 8 hours as needed for the next few days.  If you haven't already, sign up for My Chart to have easy access to your labs results, and communication with your primary care physician. We are checking some labs today. If they are abnormal, I will call you. If they are normal, I will send you a MyChart message (if it is active) or a letter in the mail. If you do not hear about your labs in the next 2 weeks, please call the office. Return if symptoms worsen or fail to improve. Please arrive 15 minutes before your appointment to ensure smooth check in process.  We appreciate your efforts in making this happen.  Thank you for allowing me to participate in your care, Shelby Mattocks, DO 09/19/2023, 11:14 AM PGY-3, [facility]

## 2023-09-19 NOTE — Assessment & Plan Note (Signed)
 Diagnosed today with recheck A1c 6.7.  Diet controlled, educated during counter to continue with appropriate diet and exercise.  During future visit she will need diabetic testing consisting of BMP, microalbumin/creatinine ratio, diabetic eye exam referral, pneumonia vaccine.

## 2023-09-19 NOTE — Progress Notes (Signed)
  SUBJECTIVE:   CHIEF COMPLAINT / HPI:   Elevated blood pressure to 140/80 at last clinic visit on 08/30/2023.  She has previous history of elevated pressures in the last couple clinic visits.   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   Healthcare maintenance: Due for colonoscopy.  PERTINENT  PMH / PSH: Tobacco use, diabetes?  OBJECTIVE:  BP 124/75   Pulse 69   Ht 5' 6.5" (1.689 m)   Wt 195 lb 3.2 oz (88.5 kg)   LMP 07/26/2022   SpO2 99%   BMI 31.03 kg/m  General: Well-appearing, NAD CV: RRR, was auscultated MSK: Trace swelling of right third digit, Tinel's and Phalen negative, no appreciable trigger finger or palpable nodule  ASSESSMENT/PLAN:   Assessment & Plan Pain of finger of right hand Suspect related to osteoarthritis given she works as a Clinical biochemist.  Negative Tinel and Phalen.  Trial ibuprofen 600 mg every 8 hours as needed for the next several days.  Should she develop worsening symptoms especially involving first and second finger or begins to have weakness, recommend referral to sports medicine for evaluation of median nerve via ultrasound and consideration of carpal tunnel. Screen for colon cancer Referral to GI for colonoscopy. Screening for hyperlipidemia Recheck lipid panel. Controlled type 2 diabetes mellitus without complication, without long-term current use of insulin (HCC) Diagnosed today with recheck A1c 6.7.  Diet controlled, educated during counter to continue with appropriate diet and exercise.  During future visit she will need diabetic testing consisting of BMP, microalbumin/creatinine ratio, diabetic eye exam referral, pneumonia vaccine. Elevated BP without diagnosis of hypertension Normotensive today, antihypertensives not indicated. Return if symptoms worsen or fail to improve. Shelby Mattocks, DO 09/19/2023, 3:17 PM PGY-3, [facility]

## 2023-09-19 NOTE — Assessment & Plan Note (Addendum)
 Normotensive today, antihypertensives not indicated.

## 2023-09-20 LAB — LIPID PANEL
Chol/HDL Ratio: 2.3 {ratio} (ref 0.0–4.4)
Cholesterol, Total: 152 mg/dL (ref 100–199)
HDL: 67 mg/dL (ref >39)
LDL Chol Calc (NIH): 75 mg/dL (ref 0–99)
Triglycerides: 46 mg/dL (ref 0–149)
VLDL Cholesterol Cal: 10 mg/dL (ref 5–40)

## 2023-10-15 ENCOUNTER — Encounter (HOSPITAL_BASED_OUTPATIENT_CLINIC_OR_DEPARTMENT_OTHER): Payer: Self-pay

## 2023-10-15 ENCOUNTER — Emergency Department (HOSPITAL_BASED_OUTPATIENT_CLINIC_OR_DEPARTMENT_OTHER): Payer: Self-pay

## 2023-10-15 ENCOUNTER — Emergency Department (HOSPITAL_BASED_OUTPATIENT_CLINIC_OR_DEPARTMENT_OTHER)
Admission: EM | Admit: 2023-10-15 | Discharge: 2023-10-15 | Disposition: A | Discharge status: Home / Self Care | Payer: Self-pay | Attending: Emergency Medicine | Admitting: Emergency Medicine

## 2023-10-15 ENCOUNTER — Other Ambulatory Visit: Payer: Self-pay

## 2023-10-15 DIAGNOSIS — F1290 Cannabis use, unspecified, uncomplicated: Secondary | ICD-10-CM | POA: Insufficient documentation

## 2023-10-15 DIAGNOSIS — K529 Noninfective gastroenteritis and colitis, unspecified: Secondary | ICD-10-CM | POA: Insufficient documentation

## 2023-10-15 LAB — CBC WITH DIFFERENTIAL/PLATELET
Abs Immature Granulocytes: 0.02 10*3/uL (ref 0.00–0.07)
Basophils Absolute: 0 10*3/uL (ref 0.0–0.1)
Basophils Relative: 0 %
Eosinophils Absolute: 0 10*3/uL (ref 0.0–0.5)
Eosinophils Relative: 0 %
HCT: 37.7 % (ref 36.0–46.0)
Hemoglobin: 12.8 g/dL (ref 12.0–15.0)
Immature Granulocytes: 0 %
Lymphocytes Relative: 14 %
Lymphs Abs: 1.1 10*3/uL (ref 0.7–4.0)
MCH: 28.6 pg (ref 26.0–34.0)
MCHC: 34 g/dL (ref 30.0–36.0)
MCV: 84.3 fL (ref 80.0–100.0)
Monocytes Absolute: 0.2 10*3/uL (ref 0.1–1.0)
Monocytes Relative: 2 %
Neutro Abs: 6.5 10*3/uL (ref 1.7–7.7)
Neutrophils Relative %: 84 %
Platelets: 144 10*3/uL — ABNORMAL LOW (ref 150–400)
RBC: 4.47 MIL/uL (ref 3.87–5.11)
RDW: 14.5 % (ref 11.5–15.5)
WBC: 7.8 10*3/uL (ref 4.0–10.5)
nRBC: 0 % (ref 0.0–0.2)

## 2023-10-15 LAB — RESP PANEL BY RT-PCR (RSV, FLU A&B, COVID)  RVPGX2
Influenza A by PCR: NEGATIVE
Influenza B by PCR: NEGATIVE
Resp Syncytial Virus by PCR: NEGATIVE
SARS Coronavirus 2 by RT PCR: NEGATIVE

## 2023-10-15 LAB — COMPREHENSIVE METABOLIC PANEL
ALT: 13 U/L (ref 0–44)
AST: 17 U/L (ref 15–41)
Albumin: 4.3 g/dL (ref 3.5–5.0)
Alkaline Phosphatase: 61 U/L (ref 38–126)
Anion gap: 12 (ref 5–15)
BUN: 16 mg/dL (ref 6–20)
CO2: 23 mmol/L (ref 22–32)
Calcium: 9.5 mg/dL (ref 8.9–10.3)
Chloride: 104 mmol/L (ref 98–111)
Creatinine, Ser: 0.81 mg/dL (ref 0.44–1.00)
GFR, Estimated: >60 mL/min (ref >60)
Glucose, Bld: 144 mg/dL — ABNORMAL HIGH (ref 70–99)
Potassium: 3.7 mmol/L (ref 3.5–5.1)
Sodium: 139 mmol/L (ref 135–145)
Total Bilirubin: 0.5 mg/dL (ref 0.0–1.2)
Total Protein: 7.1 g/dL (ref 6.5–8.1)

## 2023-10-15 LAB — LIPASE, BLOOD: Lipase: 25 U/L (ref 11–51)

## 2023-10-15 MED ORDER — FAMOTIDINE IN NACL 20-0.9 MG/50ML-% IV SOLN
20.0000 mg | Freq: Once | INTRAVENOUS | Status: AC
  Administered 2023-10-15: 20 mg via INTRAVENOUS
  Filled 2023-10-15: qty 50

## 2023-10-15 MED ORDER — PROCHLORPERAZINE EDISYLATE 10 MG/2ML IJ SOLN
10.0000 mg | Freq: Once | INTRAMUSCULAR | Status: AC
  Administered 2023-10-15: 10 mg via INTRAVENOUS
  Filled 2023-10-15: qty 2

## 2023-10-15 MED ORDER — ONDANSETRON HCL 4 MG/2ML IJ SOLN
4.0000 mg | Freq: Once | INTRAMUSCULAR | Status: AC
  Administered 2023-10-15: 4 mg via INTRAVENOUS
  Filled 2023-10-15: qty 2

## 2023-10-15 MED ORDER — METOCLOPRAMIDE HCL 10 MG PO TABS: 10.0000 mg | ORAL_TABLET | Freq: Four times a day (QID) | ORAL | 0 refills | Status: AC

## 2023-10-15 MED ORDER — ONDANSETRON 4 MG PO TBDP: 4.0000 mg | ORAL_TABLET | Freq: Once | ORAL | Status: DC

## 2023-10-15 MED ORDER — SODIUM CHLORIDE 0.9 % IV BOLUS
1000.0000 mL | Freq: Once | INTRAVENOUS | Status: AC
  Administered 2023-10-15: 1000 mL via INTRAVENOUS

## 2023-10-15 MED ORDER — KETOROLAC TROMETHAMINE 15 MG/ML IJ SOLN
15.0000 mg | Freq: Once | INTRAMUSCULAR | Status: AC
  Administered 2023-10-15: 15 mg via INTRAVENOUS
  Filled 2023-10-15: qty 1

## 2023-10-15 MED ORDER — IOHEXOL 300 MG/ML  SOLN
100.0000 mL | Freq: Once | INTRAMUSCULAR | Status: AC | PRN
  Administered 2023-10-15: 100 mL via INTRAVENOUS

## 2023-10-15 MED ORDER — ALUM & MAG HYDROXIDE-SIMETH 200-200-20 MG/5ML PO SUSP
30.0000 mL | Freq: Once | ORAL | Status: AC
  Administered 2023-10-15: 30 mL via ORAL
  Filled 2023-10-15: qty 30

## 2023-10-15 MED ORDER — FAMOTIDINE 20 MG PO TABS: 20.0000 mg | ORAL_TABLET | Freq: Every day | ORAL | 0 refills | Status: AC

## 2023-10-15 MED ORDER — LIDOCAINE VISCOUS HCL 2 % MT SOLN
15.0000 mL | Freq: Once | OROMUCOSAL | Status: AC
  Administered 2023-10-15: 15 mL via ORAL
  Filled 2023-10-15: qty 15

## 2023-10-15 MED ORDER — DROPERIDOL 2.5 MG/ML IJ SOLN
2.5000 mg | Freq: Once | INTRAMUSCULAR | Status: AC
  Administered 2023-10-15: 2.5 mg via INTRAVENOUS

## 2023-10-15 NOTE — ED Triage Notes (Signed)
 Pt presents via POV c/o N/V/D and abd pain since yesterday. Ambulatory to triage. A&O x4.

## 2023-10-15 NOTE — Discharge Instructions (Addendum)
 Your symptoms are consistent with likely gastroenteritis which is likely due to a viral or bacterial infection and should be self-limited with the next few days.  Reglan has been prescribed for nausea.  Additionally, your symptoms could be exacerbated by cannabis use, leading to a syndrome called cannabis hyperemesis syndrome which is uncontrolled nausea and vomiting in the setting of cannabis use and THC buildup.  Continue to push oral rehydration at home, return for any worsening symptoms.  Your CT imaging showed possible mild gastritis which we will discharge on a course of Pepcid

## 2023-10-15 NOTE — ED Provider Notes (Signed)
 [place] EMERGENCY DEPARTMENT AT [facility] Provider Note   CSN: [id number] Arrival date & time: 10/15/23  1191     History  Chief Complaint  Patient presents with   Nausea    Rita Burke is a 52 y.o. female.  HPI   53 year old female with no significant past medical history who presents to the emergency department with a chief complaint of nausea, vomiting, diarrhea and generalized abdominal discomfort.  The patient states that her symptoms came on yesterday evening.  She has been having difficulty tolerating oral intake ever since.  She endorses multiple episodes of loose watery stools as well as NBNB emesis.  She endorses generalized abdominal discomfort with no focality.  No fevers or chills.  No known sick contacts.  No suspect food intake to that she knows of.  She denies any genitourinary symptoms.  Home Medications Prior to Admission medications   Medication Sig Start Date End Date Taking? Authorizing Provider  famotidine (PEPCID) 20 MG tablet Take 1 tablet (20 mg total) by mouth daily for 14 days. 10/15/23 10/29/23 Yes Ernie Avena, MD  metoCLOPramide (REGLAN) 10 MG tablet Take 1 tablet (10 mg total) by mouth every 6 (six) hours. 10/15/23  Yes Ernie Avena, MD      Allergies    Penicillins    Review of Systems   Review of Systems  All other systems reviewed and are negative.   Physical Exam Updated Vital Signs BP (!) 149/69   Pulse 66   Temp 99.6 F (37.6 C) (Oral)   Resp 18   LMP 07/26/2022   SpO2 95%  Physical Exam Vitals and nursing note reviewed.  Constitutional:      General: She is not in acute distress.    Appearance: She is well-developed.  HENT:     Head: Normocephalic and atraumatic.  Eyes:     Conjunctiva/sclera: Conjunctivae normal.  Cardiovascular:     Rate and Rhythm: Normal rate and regular rhythm.     Heart sounds: No murmur heard. Pulmonary:     Effort: Pulmonary effort is normal. No respiratory distress.     Breath  sounds: Normal breath sounds.  Abdominal:     Palpations: Abdomen is soft.     Tenderness: There is generalized abdominal tenderness. There is no guarding or rebound.     Comments: Very mild generalized abdominal tenderness to palpation  Musculoskeletal:        General: No swelling.     Cervical back: Neck supple.  Skin:    General: Skin is warm and dry.     Capillary Refill: Capillary refill takes less than 2 seconds.  Neurological:     Mental Status: She is alert.  Psychiatric:        Mood and Affect: Mood normal.     ED Results / Procedures / Treatments   Labs (all labs ordered are listed, but only abnormal results are displayed) Labs Reviewed  CBC WITH DIFFERENTIAL/PLATELET - Abnormal; Notable for the following components:      Result Value   Platelets 144 (*)    All other components within normal limits  COMPREHENSIVE METABOLIC PANEL - Abnormal; Notable for the following components:   Glucose, Bld 144 (*)    All other components within normal limits  RESP PANEL BY RT-PCR (RSV, FLU A&B, COVID)  RVPGX2  LIPASE, BLOOD    EKG EKG Interpretation Date/Time:  Sunday October 15 2023 10:19:15 EDT Ventricular Rate:  71 PR Interval:  170 QRS Duration:  95 QT Interval:  425 QTC Calculation: 462 R Axis:   50  Text Interpretation: Sinus rhythm LAE, consider biatrial enlargement Confirmed by Ernie Avena ([id number]) on 10/15/2023 10:22:10 AM  Radiology CT ABDOMEN PELVIS W CONTRAST Result Date: 10/15/2023 CLINICAL DATA:  One day history of generalized abdominal pain with nausea, vomiting, diarrhea EXAM: CT ABDOMEN AND PELVIS WITH CONTRAST TECHNIQUE: Multidetector CT imaging of the abdomen and pelvis was performed using the standard protocol following bolus administration of intravenous contrast. RADIATION DOSE REDUCTION: This exam was performed according to the departmental dose-optimization program which includes automated exposure control, adjustment of the mA and/or kV according to  patient size and/or use of iterative reconstruction technique. CONTRAST:  OMNIPAQUE IOHEXOL 300 MG/ML  SOLN COMPARISON:  None Available. FINDINGS: Lower chest: No focal consolidation or pulmonary nodule in the lung bases. No pleural effusion or pneumothorax demonstrated. Partially imaged heart size is normal. Hepatobiliary: Subcentimeter segment 6 hypodensities (2:29, 30), too small to characterize. No intra or extrahepatic biliary ductal dilation. Normal gallbladder. Pancreas: Suggestion of pancreas divisum. No focal lesions or main ductal dilation. Spleen: Normal in size without focal abnormality. Adrenals/Urinary Tract: No adrenal nodules. No suspicious renal mass, calculi or hydronephrosis. No focal bladder wall thickening. Stomach/Bowel: Mild mural thickening of the underdistended gastric antrum. No evidence of bowel wall thickening, distention, or inflammatory changes. Colon is diffusely underdistended. Normal appendix. Vascular/Lymphatic: No significant vascular findings are present. No enlarged abdominal or pelvic lymph nodes. Reproductive: No adnexal masses. Intrauterine device in-situ. Ovoid 7 mm enhancing focus within the right posterior uterine body (6:63), likely leiomyoma. Other: No free fluid, fluid collection, or free air. Musculoskeletal: No acute or abnormal lytic or blastic osseous lesions. 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 IMPRESSION: Mild mural thickening of the underdistended gastric antrum, which may be related to underdistention or gastritis. Electronically Signed   By: Agustin Cree M.D.   On: 10/15/2023 09:27    Procedures Procedures    Medications Ordered in ED Medications  famotidine (PEPCID) IVPB 20 mg premix (20 mg Intravenous New Bag/Given 10/15/23 1000)  sodium chloride 0.9 % bolus 1,000 mL (1,000 mLs Intravenous New Bag/Given 10/15/23 0815)  ondansetron (ZOFRAN) injection 4 mg (4 mg Intravenous  Given 10/15/23 0752)  ketorolac (TORADOL) 15 MG/ML injection 15 mg (15 mg Intravenous Given 10/15/23 0744)  prochlorperazine (COMPAZINE) injection 10 mg (10 mg Intravenous Given 10/15/23 0922)  iohexol (OMNIPAQUE) 300 MG/ML solution 100 mL (100 mLs Intravenous Contrast Given 10/15/23 0905)  droperidol (INAPSINE) 2.5 MG/ML injection 2.5 mg (2.5 mg Intravenous Given 10/15/23 1006)  alum & mag hydroxide-simeth (MAALOX/MYLANTA) 200-200-20 MG/5ML suspension 30 mL (30 mLs Oral Given 10/15/23 1004)    And  lidocaine (XYLOCAINE) 2 % viscous mouth solution 15 mL (15 mLs Oral Given 10/15/23 1004)    ED Course/ Medical Decision Making/ A&P                                 Medical Decision Making Amount and/or Complexity of Data Reviewed Radiology: ordered.  Risk OTC drugs. Prescription drug management.    53 year old female with no significant past medical history who presents to the emergency department with a chief complaint of nausea, vomiting, diarrhea and generalized abdominal discomfort.  The patient states that her symptoms came on yesterday evening.  She has been having difficulty tolerating oral intake ever since.  She endorses multiple episodes of  loose watery stools as well as NBNB emesis.  She endorses generalized abdominal discomfort with no focality.  No fevers or chills.  No known sick contacts.  No suspect food intake to that she knows of.  She denies any genitourinary symptoms.  On arrival, the patient was afebrile, temperature 99.6, hemodynamically stable, pulse 75, respiratory rate 16, BP 131/78, saturating 99% on room air.  Sinus rhythm noted on cardiac telemetry.  Physical exam revealed generalized abdominal tenderness to palpation, no rebound or guarding.  Reviewed and confirmed nursing documentation for past medical history, family history, social history.    Initial Assessment:   With the patient's presentation of abdominal pain, most likely diagnosis is gastroenteritis. Other  diagnoses were considered including (but not limited to) gastroenteritis, colitis, small bowel obstruction, appendicitis, cholecystitis, pancreatitis, nephrolithiasis, UTI, pyelonephritis, diverticulitis. These are considered less likely due to history of present illness and physical exam findings.      Initial Plan:  CBC/CMP to evaluate for underlying infectious/metabolic etiology for patient's abdominal pain  Lipase to evaluate for pancreatitis  EKG to evaluate for cardiac source of pain  CTAB/Pelvis with contrast to evaluate for structural/surgical etiology of patients' severe abdominal pain.  Urinalysis and repeat physical assessment to evaluate for UTI/Pyelonpehritis  Empiric management of symptoms with escalating pain control and antiemetics as needed.   Initial Study Results:   Laboratory  All laboratory results reviewed without evidence of clinically relevant pathology.   Exceptions include: mild hyperglycemia to 144    EKG EKG was reviewed independently. Rate, rhythm, axis, intervals all examined and without medically relevant abnormality. ST segments without concerns for elevations.    Radiology All images reviewed independently. Agree with radiology report at this time.   CT ABDOMEN PELVIS W CONTRAST Result Date: 10/15/2023 CLINICAL DATA:  One day history of generalized abdominal pain with nausea, vomiting, diarrhea EXAM: CT ABDOMEN AND PELVIS WITH CONTRAST TECHNIQUE: Multidetector CT imaging of the abdomen and pelvis was performed using the standard protocol following bolus administration of intravenous contrast. RADIATION DOSE REDUCTION: This exam was performed according to the departmental dose-optimization program which includes automated exposure control, adjustment of the mA and/or kV according to patient size and/or use of iterative reconstruction technique. CONTRAST:  OMNIPAQUE IOHEXOL 300 MG/ML  SOLN COMPARISON:  None Available. FINDINGS: Lower chest: No focal  consolidation or pulmonary nodule in the lung bases. No pleural effusion or pneumothorax demonstrated. Partially imaged heart size is normal. Hepatobiliary: Subcentimeter segment 6 hypodensities (2:29, 30), too small to characterize. No intra or extrahepatic biliary ductal dilation. Normal gallbladder. Pancreas: Suggestion of pancreas divisum. No focal lesions or main ductal dilation. Spleen: Normal in size without focal abnormality. Adrenals/Urinary Tract: No adrenal nodules. No suspicious renal mass, calculi or hydronephrosis. No focal bladder wall thickening. Stomach/Bowel: Mild mural thickening of the underdistended gastric antrum. No evidence of bowel wall thickening, distention, or inflammatory changes. Colon is diffusely underdistended. Normal appendix. Vascular/Lymphatic: No significant vascular findings are present. No enlarged abdominal or pelvic lymph nodes. Reproductive: No adnexal masses. Intrauterine device in-situ. Ovoid 7 mm enhancing focus within the right posterior uterine body (6:63), likely leiomyoma. Other: No free fluid, fluid collection, or free air. Musculoskeletal: No acute or abnormal lytic or blastic osseous lesions. 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 IMPRESSION: Mild mural thickening of the underdistended gastric antrum, which may be related to underdistention or gastritis. Electronically Signed   By: Agustin Cree M.D.   On: 10/15/2023 09:27  Final Reassessment and Plan:   Patient with persistent nausea and vomiting after initial Zofran use.  Administered Compazine.  On repeat assessment, patient felt persistently nauseated.  She does admit to chronic daily cannabis use.  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  EKG was obtained, QTc within normal limits, administered droperidol.  CT imaging showing evidence of mild thickening of the gastric antrum which could indicate gastritis, IV Pepcid  was administered.  The patient was p.o. challenged with Maalox and lidocaine and was subsequently found to be tolerating oral intake and overall well-appearing.  No other abnormalities found on labs or CT imaging.  Patient feeling symptomatically improved on repeat assessment, tolerating oral intake, stable for discharge.  Will prescribe Reglan and Pepcid, return precautions provided.   Final Clinical Impression(s) / ED Diagnoses Final diagnoses:  Gastroenteritis  Cannabis use without complication    Rx / DC Orders ED Discharge Orders          Ordered    famotidine (PEPCID) 20 MG tablet  Daily        10/15/23 1024    metoCLOPramide (REGLAN) 10 MG tablet  Every 6 hours        10/15/23 1024              Ernie Avena, MD 10/15/23 1024

## 2024-01-02 ENCOUNTER — Encounter: Payer: Self-pay | Admitting: *Deleted
